# Patient Record
Sex: Male | Born: 1950 | Race: White | Hispanic: No | Marital: Married | Smoking: Current every day smoker
Health system: Southern US, Community
[De-identification: ages and names within clinical notes are randomized; demographics above are authoritative.]

## PROBLEM LIST (undated history)

## (undated) DIAGNOSIS — F411 Generalized anxiety disorder: Secondary | ICD-10-CM

## (undated) DIAGNOSIS — M720 Palmar fascial fibromatosis [Dupuytren]: Secondary | ICD-10-CM

## (undated) DIAGNOSIS — M1990 Unspecified osteoarthritis, unspecified site: Secondary | ICD-10-CM

## (undated) DIAGNOSIS — I10 Essential (primary) hypertension: Secondary | ICD-10-CM

## (undated) DIAGNOSIS — I4891 Unspecified atrial fibrillation: Secondary | ICD-10-CM

## (undated) DIAGNOSIS — I499 Cardiac arrhythmia, unspecified: Secondary | ICD-10-CM

## (undated) DIAGNOSIS — I639 Cerebral infarction, unspecified: Secondary | ICD-10-CM

## (undated) DIAGNOSIS — B1920 Unspecified viral hepatitis C without hepatic coma: Secondary | ICD-10-CM

## (undated) DIAGNOSIS — I509 Heart failure, unspecified: Secondary | ICD-10-CM

## (undated) DIAGNOSIS — J449 Chronic obstructive pulmonary disease, unspecified: Secondary | ICD-10-CM

## (undated) HISTORY — PX: TENDON REPAIR: SHX5111

## (undated) HISTORY — PX: HERNIA REPAIR: SHX51

---

## 2019-10-13 DIAGNOSIS — I10 Essential (primary) hypertension: Secondary | ICD-10-CM | POA: Diagnosis not present

## 2019-10-13 DIAGNOSIS — E785 Hyperlipidemia, unspecified: Secondary | ICD-10-CM | POA: Diagnosis not present

## 2019-10-13 DIAGNOSIS — Z125 Encounter for screening for malignant neoplasm of prostate: Secondary | ICD-10-CM | POA: Diagnosis not present

## 2019-10-13 DIAGNOSIS — J449 Chronic obstructive pulmonary disease, unspecified: Secondary | ICD-10-CM | POA: Diagnosis not present

## 2019-10-13 DIAGNOSIS — Z9181 History of falling: Secondary | ICD-10-CM | POA: Diagnosis not present

## 2019-10-13 DIAGNOSIS — I509 Heart failure, unspecified: Secondary | ICD-10-CM | POA: Diagnosis not present

## 2019-10-13 DIAGNOSIS — Z8673 Personal history of transient ischemic attack (TIA), and cerebral infarction without residual deficits: Secondary | ICD-10-CM | POA: Diagnosis not present

## 2019-10-13 DIAGNOSIS — Z139 Encounter for screening, unspecified: Secondary | ICD-10-CM | POA: Diagnosis not present

## 2019-10-13 DIAGNOSIS — Z79899 Other long term (current) drug therapy: Secondary | ICD-10-CM | POA: Diagnosis not present

## 2020-01-18 DIAGNOSIS — Z20828 Contact with and (suspected) exposure to other viral communicable diseases: Secondary | ICD-10-CM | POA: Diagnosis not present

## 2020-01-18 DIAGNOSIS — I509 Heart failure, unspecified: Secondary | ICD-10-CM | POA: Diagnosis not present

## 2020-01-18 DIAGNOSIS — Z87891 Personal history of nicotine dependence: Secondary | ICD-10-CM | POA: Diagnosis not present

## 2020-01-18 DIAGNOSIS — I252 Old myocardial infarction: Secondary | ICD-10-CM | POA: Diagnosis not present

## 2020-01-18 DIAGNOSIS — I4891 Unspecified atrial fibrillation: Secondary | ICD-10-CM | POA: Diagnosis not present

## 2020-01-18 DIAGNOSIS — R079 Chest pain, unspecified: Secondary | ICD-10-CM | POA: Diagnosis not present

## 2020-01-18 DIAGNOSIS — F411 Generalized anxiety disorder: Secondary | ICD-10-CM | POA: Diagnosis not present

## 2020-01-18 DIAGNOSIS — B182 Chronic viral hepatitis C: Secondary | ICD-10-CM | POA: Diagnosis not present

## 2020-01-18 DIAGNOSIS — I110 Hypertensive heart disease with heart failure: Secondary | ICD-10-CM | POA: Diagnosis not present

## 2020-01-18 DIAGNOSIS — I639 Cerebral infarction, unspecified: Secondary | ICD-10-CM | POA: Diagnosis not present

## 2020-01-18 DIAGNOSIS — J449 Chronic obstructive pulmonary disease, unspecified: Secondary | ICD-10-CM | POA: Diagnosis not present

## 2020-01-18 DIAGNOSIS — R0602 Shortness of breath: Secondary | ICD-10-CM | POA: Diagnosis not present

## 2020-01-18 DIAGNOSIS — I10 Essential (primary) hypertension: Secondary | ICD-10-CM | POA: Diagnosis not present

## 2020-01-18 DIAGNOSIS — I5033 Acute on chronic diastolic (congestive) heart failure: Secondary | ICD-10-CM | POA: Diagnosis not present

## 2020-01-18 DIAGNOSIS — Z8673 Personal history of transient ischemic attack (TIA), and cerebral infarction without residual deficits: Secondary | ICD-10-CM | POA: Diagnosis not present

## 2020-01-19 DIAGNOSIS — B182 Chronic viral hepatitis C: Secondary | ICD-10-CM | POA: Diagnosis not present

## 2020-01-19 DIAGNOSIS — I5033 Acute on chronic diastolic (congestive) heart failure: Secondary | ICD-10-CM | POA: Diagnosis not present

## 2020-01-19 DIAGNOSIS — F17211 Nicotine dependence, cigarettes, in remission: Secondary | ICD-10-CM | POA: Diagnosis not present

## 2020-01-19 DIAGNOSIS — I4891 Unspecified atrial fibrillation: Secondary | ICD-10-CM | POA: Diagnosis not present

## 2020-01-19 DIAGNOSIS — F411 Generalized anxiety disorder: Secondary | ICD-10-CM | POA: Diagnosis not present

## 2020-01-19 DIAGNOSIS — I517 Cardiomegaly: Secondary | ICD-10-CM | POA: Diagnosis not present

## 2020-01-19 DIAGNOSIS — I081 Rheumatic disorders of both mitral and tricuspid valves: Secondary | ICD-10-CM | POA: Diagnosis not present

## 2020-01-19 DIAGNOSIS — I420 Dilated cardiomyopathy: Secondary | ICD-10-CM | POA: Diagnosis not present

## 2020-01-19 DIAGNOSIS — I10 Essential (primary) hypertension: Secondary | ICD-10-CM | POA: Diagnosis not present

## 2020-01-19 DIAGNOSIS — I252 Old myocardial infarction: Secondary | ICD-10-CM | POA: Diagnosis not present

## 2020-01-19 DIAGNOSIS — J431 Panlobular emphysema: Secondary | ICD-10-CM | POA: Diagnosis not present

## 2020-01-19 DIAGNOSIS — J449 Chronic obstructive pulmonary disease, unspecified: Secondary | ICD-10-CM | POA: Diagnosis not present

## 2020-01-19 DIAGNOSIS — I110 Hypertensive heart disease with heart failure: Secondary | ICD-10-CM | POA: Diagnosis not present

## 2020-01-19 DIAGNOSIS — Z87891 Personal history of nicotine dependence: Secondary | ICD-10-CM | POA: Diagnosis not present

## 2020-01-19 DIAGNOSIS — Z8673 Personal history of transient ischemic attack (TIA), and cerebral infarction without residual deficits: Secondary | ICD-10-CM | POA: Diagnosis not present

## 2020-01-20 DIAGNOSIS — I69998 Other sequelae following unspecified cerebrovascular disease: Secondary | ICD-10-CM | POA: Diagnosis not present

## 2020-01-20 DIAGNOSIS — J431 Panlobular emphysema: Secondary | ICD-10-CM | POA: Diagnosis not present

## 2020-01-20 DIAGNOSIS — I081 Rheumatic disorders of both mitral and tricuspid valves: Secondary | ICD-10-CM | POA: Diagnosis not present

## 2020-01-20 DIAGNOSIS — F17211 Nicotine dependence, cigarettes, in remission: Secondary | ICD-10-CM | POA: Diagnosis not present

## 2020-01-20 DIAGNOSIS — G4730 Sleep apnea, unspecified: Secondary | ICD-10-CM | POA: Diagnosis not present

## 2020-01-20 DIAGNOSIS — B1920 Unspecified viral hepatitis C without hepatic coma: Secondary | ICD-10-CM | POA: Diagnosis not present

## 2020-01-20 DIAGNOSIS — I5033 Acute on chronic diastolic (congestive) heart failure: Secondary | ICD-10-CM | POA: Diagnosis not present

## 2020-01-20 DIAGNOSIS — M1990 Unspecified osteoarthritis, unspecified site: Secondary | ICD-10-CM | POA: Diagnosis not present

## 2020-01-20 DIAGNOSIS — R079 Chest pain, unspecified: Secondary | ICD-10-CM | POA: Diagnosis not present

## 2020-01-20 DIAGNOSIS — I110 Hypertensive heart disease with heart failure: Secondary | ICD-10-CM | POA: Diagnosis not present

## 2020-01-20 DIAGNOSIS — I420 Dilated cardiomyopathy: Secondary | ICD-10-CM | POA: Diagnosis not present

## 2020-01-20 DIAGNOSIS — I4891 Unspecified atrial fibrillation: Secondary | ICD-10-CM | POA: Diagnosis not present

## 2020-01-20 DIAGNOSIS — I517 Cardiomegaly: Secondary | ICD-10-CM | POA: Diagnosis not present

## 2020-01-20 DIAGNOSIS — I509 Heart failure, unspecified: Secondary | ICD-10-CM | POA: Diagnosis not present

## 2020-01-20 DIAGNOSIS — Z8673 Personal history of transient ischemic attack (TIA), and cerebral infarction without residual deficits: Secondary | ICD-10-CM | POA: Diagnosis not present

## 2020-01-20 DIAGNOSIS — J449 Chronic obstructive pulmonary disease, unspecified: Secondary | ICD-10-CM | POA: Diagnosis not present

## 2020-01-20 DIAGNOSIS — I10 Essential (primary) hypertension: Secondary | ICD-10-CM | POA: Diagnosis not present

## 2020-01-20 DIAGNOSIS — Z7901 Long term (current) use of anticoagulants: Secondary | ICD-10-CM | POA: Diagnosis not present

## 2020-01-21 DIAGNOSIS — Z8673 Personal history of transient ischemic attack (TIA), and cerebral infarction without residual deficits: Secondary | ICD-10-CM | POA: Diagnosis not present

## 2020-01-21 DIAGNOSIS — I4891 Unspecified atrial fibrillation: Secondary | ICD-10-CM | POA: Diagnosis not present

## 2020-01-21 DIAGNOSIS — I10 Essential (primary) hypertension: Secondary | ICD-10-CM | POA: Diagnosis not present

## 2020-01-21 DIAGNOSIS — I420 Dilated cardiomyopathy: Secondary | ICD-10-CM | POA: Diagnosis not present

## 2020-01-21 DIAGNOSIS — J431 Panlobular emphysema: Secondary | ICD-10-CM | POA: Diagnosis not present

## 2020-01-21 DIAGNOSIS — F17211 Nicotine dependence, cigarettes, in remission: Secondary | ICD-10-CM | POA: Diagnosis not present

## 2020-01-21 DIAGNOSIS — I5033 Acute on chronic diastolic (congestive) heart failure: Secondary | ICD-10-CM | POA: Diagnosis not present

## 2020-02-01 DIAGNOSIS — I480 Paroxysmal atrial fibrillation: Secondary | ICD-10-CM | POA: Diagnosis not present

## 2020-02-01 DIAGNOSIS — I5021 Acute systolic (congestive) heart failure: Secondary | ICD-10-CM | POA: Diagnosis not present

## 2020-02-01 DIAGNOSIS — I10 Essential (primary) hypertension: Secondary | ICD-10-CM | POA: Diagnosis not present

## 2020-02-16 DIAGNOSIS — I509 Heart failure, unspecified: Secondary | ICD-10-CM | POA: Diagnosis not present

## 2020-02-16 DIAGNOSIS — J449 Chronic obstructive pulmonary disease, unspecified: Secondary | ICD-10-CM | POA: Diagnosis not present

## 2020-02-16 DIAGNOSIS — I4891 Unspecified atrial fibrillation: Secondary | ICD-10-CM | POA: Diagnosis not present

## 2020-02-16 DIAGNOSIS — I10 Essential (primary) hypertension: Secondary | ICD-10-CM | POA: Diagnosis not present

## 2020-02-16 DIAGNOSIS — Z6828 Body mass index (BMI) 28.0-28.9, adult: Secondary | ICD-10-CM | POA: Diagnosis not present

## 2020-05-11 DIAGNOSIS — J449 Chronic obstructive pulmonary disease, unspecified: Secondary | ICD-10-CM | POA: Diagnosis not present

## 2020-05-11 DIAGNOSIS — I4891 Unspecified atrial fibrillation: Secondary | ICD-10-CM | POA: Diagnosis not present

## 2020-05-11 DIAGNOSIS — I509 Heart failure, unspecified: Secondary | ICD-10-CM | POA: Diagnosis not present

## 2020-05-11 DIAGNOSIS — E785 Hyperlipidemia, unspecified: Secondary | ICD-10-CM | POA: Diagnosis not present

## 2020-05-11 DIAGNOSIS — Z79899 Other long term (current) drug therapy: Secondary | ICD-10-CM | POA: Diagnosis not present

## 2020-05-11 DIAGNOSIS — Z6829 Body mass index (BMI) 29.0-29.9, adult: Secondary | ICD-10-CM | POA: Diagnosis not present

## 2020-05-16 DIAGNOSIS — I509 Heart failure, unspecified: Secondary | ICD-10-CM | POA: Diagnosis not present

## 2020-05-16 DIAGNOSIS — S301XXA Contusion of abdominal wall, initial encounter: Secondary | ICD-10-CM | POA: Diagnosis not present

## 2020-05-16 DIAGNOSIS — Z0389 Encounter for observation for other suspected diseases and conditions ruled out: Secondary | ICD-10-CM | POA: Diagnosis not present

## 2020-05-16 DIAGNOSIS — Z8673 Personal history of transient ischemic attack (TIA), and cerebral infarction without residual deficits: Secondary | ICD-10-CM | POA: Diagnosis not present

## 2020-05-16 DIAGNOSIS — I10 Essential (primary) hypertension: Secondary | ICD-10-CM | POA: Diagnosis not present

## 2020-05-16 DIAGNOSIS — S61512A Laceration without foreign body of left wrist, initial encounter: Secondary | ICD-10-CM | POA: Diagnosis not present

## 2020-05-16 DIAGNOSIS — J449 Chronic obstructive pulmonary disease, unspecified: Secondary | ICD-10-CM | POA: Diagnosis not present

## 2020-05-16 DIAGNOSIS — K5790 Diverticulosis of intestine, part unspecified, without perforation or abscess without bleeding: Secondary | ICD-10-CM | POA: Diagnosis not present

## 2020-05-16 DIAGNOSIS — S60222A Contusion of left hand, initial encounter: Secondary | ICD-10-CM | POA: Diagnosis not present

## 2020-05-16 DIAGNOSIS — Z7901 Long term (current) use of anticoagulants: Secondary | ICD-10-CM | POA: Diagnosis not present

## 2020-05-16 DIAGNOSIS — E871 Hypo-osmolality and hyponatremia: Secondary | ICD-10-CM | POA: Diagnosis not present

## 2020-05-16 DIAGNOSIS — Z043 Encounter for examination and observation following other accident: Secondary | ICD-10-CM | POA: Diagnosis not present

## 2020-05-16 DIAGNOSIS — Z7982 Long term (current) use of aspirin: Secondary | ICD-10-CM | POA: Diagnosis not present

## 2020-05-16 DIAGNOSIS — S61412A Laceration without foreign body of left hand, initial encounter: Secondary | ICD-10-CM | POA: Diagnosis not present

## 2020-11-09 DIAGNOSIS — J449 Chronic obstructive pulmonary disease, unspecified: Secondary | ICD-10-CM | POA: Diagnosis not present

## 2020-11-09 DIAGNOSIS — Z79899 Other long term (current) drug therapy: Secondary | ICD-10-CM | POA: Diagnosis not present

## 2020-11-09 DIAGNOSIS — Z1331 Encounter for screening for depression: Secondary | ICD-10-CM | POA: Diagnosis not present

## 2020-11-09 DIAGNOSIS — Z125 Encounter for screening for malignant neoplasm of prostate: Secondary | ICD-10-CM | POA: Diagnosis not present

## 2020-11-09 DIAGNOSIS — Z1211 Encounter for screening for malignant neoplasm of colon: Secondary | ICD-10-CM | POA: Diagnosis not present

## 2020-11-09 DIAGNOSIS — Z6829 Body mass index (BMI) 29.0-29.9, adult: Secondary | ICD-10-CM | POA: Diagnosis not present

## 2020-11-09 DIAGNOSIS — I509 Heart failure, unspecified: Secondary | ICD-10-CM | POA: Diagnosis not present

## 2020-11-09 DIAGNOSIS — I4891 Unspecified atrial fibrillation: Secondary | ICD-10-CM | POA: Diagnosis not present

## 2020-11-09 DIAGNOSIS — E785 Hyperlipidemia, unspecified: Secondary | ICD-10-CM | POA: Diagnosis not present

## 2021-07-11 DIAGNOSIS — I420 Dilated cardiomyopathy: Secondary | ICD-10-CM | POA: Diagnosis not present

## 2021-07-11 DIAGNOSIS — I480 Paroxysmal atrial fibrillation: Secondary | ICD-10-CM | POA: Diagnosis not present

## 2021-07-11 DIAGNOSIS — E782 Mixed hyperlipidemia: Secondary | ICD-10-CM | POA: Diagnosis not present

## 2021-07-11 DIAGNOSIS — I10 Essential (primary) hypertension: Secondary | ICD-10-CM | POA: Diagnosis not present

## 2021-08-24 DIAGNOSIS — E782 Mixed hyperlipidemia: Secondary | ICD-10-CM | POA: Diagnosis not present

## 2021-08-24 DIAGNOSIS — I480 Paroxysmal atrial fibrillation: Secondary | ICD-10-CM | POA: Diagnosis not present

## 2021-08-24 DIAGNOSIS — I420 Dilated cardiomyopathy: Secondary | ICD-10-CM | POA: Diagnosis not present

## 2021-08-24 DIAGNOSIS — I10 Essential (primary) hypertension: Secondary | ICD-10-CM | POA: Diagnosis not present

## 2021-10-10 DIAGNOSIS — I420 Dilated cardiomyopathy: Secondary | ICD-10-CM | POA: Diagnosis not present

## 2021-10-10 DIAGNOSIS — E782 Mixed hyperlipidemia: Secondary | ICD-10-CM | POA: Diagnosis not present

## 2021-10-10 DIAGNOSIS — M79605 Pain in left leg: Secondary | ICD-10-CM | POA: Diagnosis not present

## 2021-10-10 DIAGNOSIS — M79604 Pain in right leg: Secondary | ICD-10-CM | POA: Diagnosis not present

## 2021-10-10 DIAGNOSIS — I10 Essential (primary) hypertension: Secondary | ICD-10-CM | POA: Diagnosis not present

## 2021-10-10 DIAGNOSIS — I480 Paroxysmal atrial fibrillation: Secondary | ICD-10-CM | POA: Diagnosis not present

## 2021-11-03 DIAGNOSIS — M542 Cervicalgia: Secondary | ICD-10-CM | POA: Diagnosis not present

## 2021-11-03 DIAGNOSIS — M546 Pain in thoracic spine: Secondary | ICD-10-CM | POA: Diagnosis not present

## 2021-11-03 DIAGNOSIS — M5136 Other intervertebral disc degeneration, lumbar region: Secondary | ICD-10-CM | POA: Diagnosis not present

## 2021-11-05 DIAGNOSIS — M542 Cervicalgia: Secondary | ICD-10-CM | POA: Diagnosis not present

## 2021-11-05 DIAGNOSIS — I509 Heart failure, unspecified: Secondary | ICD-10-CM | POA: Diagnosis not present

## 2021-11-05 DIAGNOSIS — J449 Chronic obstructive pulmonary disease, unspecified: Secondary | ICD-10-CM | POA: Diagnosis not present

## 2021-11-05 DIAGNOSIS — I4891 Unspecified atrial fibrillation: Secondary | ICD-10-CM | POA: Diagnosis not present

## 2021-11-05 DIAGNOSIS — Z6830 Body mass index (BMI) 30.0-30.9, adult: Secondary | ICD-10-CM | POA: Diagnosis not present

## 2021-11-17 ENCOUNTER — Other Ambulatory Visit: Payer: Self-pay

## 2021-11-17 ENCOUNTER — Inpatient Hospital Stay (HOSPITAL_COMMUNITY): Payer: Medicare HMO | Admitting: Certified Registered"

## 2021-11-17 ENCOUNTER — Inpatient Hospital Stay (HOSPITAL_COMMUNITY): Payer: Medicare HMO

## 2021-11-17 ENCOUNTER — Emergency Department (HOSPITAL_COMMUNITY): Payer: Medicare HMO

## 2021-11-17 ENCOUNTER — Encounter (HOSPITAL_COMMUNITY): Admission: EM | Disposition: E | Payer: Self-pay | Source: Home / Self Care | Attending: Emergency Medicine

## 2021-11-17 ENCOUNTER — Encounter (HOSPITAL_COMMUNITY): Payer: Self-pay

## 2021-11-17 ENCOUNTER — Inpatient Hospital Stay (HOSPITAL_COMMUNITY)
Admission: EM | Admit: 2021-11-17 | Discharge: 2021-12-01 | DRG: 471 | Disposition: E | Payer: Medicare HMO | Attending: Internal Medicine | Admitting: Internal Medicine

## 2021-11-17 DIAGNOSIS — Z8673 Personal history of transient ischemic attack (TIA), and cerebral infarction without residual deficits: Secondary | ICD-10-CM

## 2021-11-17 DIAGNOSIS — M436 Torticollis: Secondary | ICD-10-CM | POA: Diagnosis not present

## 2021-11-17 DIAGNOSIS — M50022 Cervical disc disorder at C5-C6 level with myelopathy: Secondary | ICD-10-CM

## 2021-11-17 DIAGNOSIS — E440 Moderate protein-calorie malnutrition: Secondary | ICD-10-CM | POA: Diagnosis present

## 2021-11-17 DIAGNOSIS — Z7901 Long term (current) use of anticoagulants: Secondary | ICD-10-CM

## 2021-11-17 DIAGNOSIS — B1920 Unspecified viral hepatitis C without hepatic coma: Secondary | ICD-10-CM | POA: Diagnosis present

## 2021-11-17 DIAGNOSIS — J189 Pneumonia, unspecified organism: Secondary | ICD-10-CM | POA: Diagnosis not present

## 2021-11-17 DIAGNOSIS — K55049 Acute infarction of large intestine, extent unspecified: Secondary | ICD-10-CM | POA: Diagnosis not present

## 2021-11-17 DIAGNOSIS — J9601 Acute respiratory failure with hypoxia: Secondary | ICD-10-CM | POA: Diagnosis not present

## 2021-11-17 DIAGNOSIS — I252 Old myocardial infarction: Secondary | ICD-10-CM | POA: Diagnosis not present

## 2021-11-17 DIAGNOSIS — T17908A Unspecified foreign body in respiratory tract, part unspecified causing other injury, initial encounter: Secondary | ICD-10-CM | POA: Diagnosis not present

## 2021-11-17 DIAGNOSIS — R739 Hyperglycemia, unspecified: Secondary | ICD-10-CM | POA: Diagnosis not present

## 2021-11-17 DIAGNOSIS — I5022 Chronic systolic (congestive) heart failure: Secondary | ICD-10-CM | POA: Diagnosis not present

## 2021-11-17 DIAGNOSIS — Z981 Arthrodesis status: Secondary | ICD-10-CM | POA: Diagnosis not present

## 2021-11-17 DIAGNOSIS — M720 Palmar fascial fibromatosis [Dupuytren]: Secondary | ICD-10-CM | POA: Diagnosis present

## 2021-11-17 DIAGNOSIS — K659 Peritonitis, unspecified: Secondary | ICD-10-CM | POA: Diagnosis not present

## 2021-11-17 DIAGNOSIS — J984 Other disorders of lung: Secondary | ICD-10-CM | POA: Diagnosis not present

## 2021-11-17 DIAGNOSIS — I2510 Atherosclerotic heart disease of native coronary artery without angina pectoris: Secondary | ICD-10-CM | POA: Diagnosis not present

## 2021-11-17 DIAGNOSIS — K5720 Diverticulitis of large intestine with perforation and abscess without bleeding: Secondary | ICD-10-CM | POA: Diagnosis present

## 2021-11-17 DIAGNOSIS — J449 Chronic obstructive pulmonary disease, unspecified: Secondary | ICD-10-CM | POA: Diagnosis not present

## 2021-11-17 DIAGNOSIS — J9811 Atelectasis: Secondary | ICD-10-CM | POA: Diagnosis not present

## 2021-11-17 DIAGNOSIS — I480 Paroxysmal atrial fibrillation: Secondary | ICD-10-CM | POA: Diagnosis not present

## 2021-11-17 DIAGNOSIS — F17210 Nicotine dependence, cigarettes, uncomplicated: Secondary | ICD-10-CM | POA: Diagnosis present

## 2021-11-17 DIAGNOSIS — R262 Difficulty in walking, not elsewhere classified: Secondary | ICD-10-CM | POA: Diagnosis not present

## 2021-11-17 DIAGNOSIS — R109 Unspecified abdominal pain: Secondary | ICD-10-CM | POA: Diagnosis not present

## 2021-11-17 DIAGNOSIS — I110 Hypertensive heart disease with heart failure: Secondary | ICD-10-CM

## 2021-11-17 DIAGNOSIS — G8250 Quadriplegia, unspecified: Secondary | ICD-10-CM

## 2021-11-17 DIAGNOSIS — I4891 Unspecified atrial fibrillation: Secondary | ICD-10-CM | POA: Diagnosis not present

## 2021-11-17 DIAGNOSIS — Z8679 Personal history of other diseases of the circulatory system: Secondary | ICD-10-CM | POA: Diagnosis not present

## 2021-11-17 DIAGNOSIS — R201 Hypoesthesia of skin: Secondary | ICD-10-CM | POA: Diagnosis present

## 2021-11-17 DIAGNOSIS — K6389 Other specified diseases of intestine: Secondary | ICD-10-CM | POA: Diagnosis not present

## 2021-11-17 DIAGNOSIS — K567 Ileus, unspecified: Secondary | ICD-10-CM | POA: Diagnosis not present

## 2021-11-17 DIAGNOSIS — J90 Pleural effusion, not elsewhere classified: Secondary | ICD-10-CM | POA: Diagnosis not present

## 2021-11-17 DIAGNOSIS — F411 Generalized anxiety disorder: Secondary | ICD-10-CM | POA: Diagnosis present

## 2021-11-17 DIAGNOSIS — E8809 Other disorders of plasma-protein metabolism, not elsewhere classified: Secondary | ICD-10-CM | POA: Diagnosis present

## 2021-11-17 DIAGNOSIS — I10 Essential (primary) hypertension: Secondary | ICD-10-CM

## 2021-11-17 DIAGNOSIS — I509 Heart failure, unspecified: Secondary | ICD-10-CM

## 2021-11-17 DIAGNOSIS — K658 Other peritonitis: Secondary | ICD-10-CM | POA: Diagnosis not present

## 2021-11-17 DIAGNOSIS — R0603 Acute respiratory distress: Secondary | ICD-10-CM | POA: Diagnosis not present

## 2021-11-17 DIAGNOSIS — Z79899 Other long term (current) drug therapy: Secondary | ICD-10-CM

## 2021-11-17 DIAGNOSIS — K429 Umbilical hernia without obstruction or gangrene: Secondary | ICD-10-CM | POA: Diagnosis not present

## 2021-11-17 DIAGNOSIS — D696 Thrombocytopenia, unspecified: Secondary | ICD-10-CM | POA: Diagnosis present

## 2021-11-17 DIAGNOSIS — J690 Pneumonitis due to inhalation of food and vomit: Secondary | ICD-10-CM | POA: Diagnosis not present

## 2021-11-17 DIAGNOSIS — M1990 Unspecified osteoarthritis, unspecified site: Secondary | ICD-10-CM

## 2021-11-17 DIAGNOSIS — R69 Illness, unspecified: Secondary | ICD-10-CM | POA: Diagnosis not present

## 2021-11-17 DIAGNOSIS — R0902 Hypoxemia: Secondary | ICD-10-CM | POA: Diagnosis not present

## 2021-11-17 DIAGNOSIS — G9520 Unspecified cord compression: Secondary | ICD-10-CM | POA: Diagnosis present

## 2021-11-17 DIAGNOSIS — E778 Other disorders of glycoprotein metabolism: Secondary | ICD-10-CM | POA: Diagnosis present

## 2021-11-17 DIAGNOSIS — Z66 Do not resuscitate: Secondary | ICD-10-CM | POA: Diagnosis not present

## 2021-11-17 DIAGNOSIS — R6521 Severe sepsis with septic shock: Secondary | ICD-10-CM | POA: Diagnosis not present

## 2021-11-17 DIAGNOSIS — M48061 Spinal stenosis, lumbar region without neurogenic claudication: Secondary | ICD-10-CM | POA: Diagnosis not present

## 2021-11-17 DIAGNOSIS — M4852XA Collapsed vertebra, not elsewhere classified, cervical region, initial encounter for fracture: Secondary | ICD-10-CM | POA: Diagnosis not present

## 2021-11-17 DIAGNOSIS — R140 Abdominal distension (gaseous): Secondary | ICD-10-CM | POA: Diagnosis not present

## 2021-11-17 DIAGNOSIS — Z91A9 Caregiver's noncompliance with patient's other medical treatment and regimen: Secondary | ICD-10-CM

## 2021-11-17 DIAGNOSIS — R54 Age-related physical debility: Secondary | ICD-10-CM | POA: Diagnosis present

## 2021-11-17 DIAGNOSIS — Z515 Encounter for palliative care: Secondary | ICD-10-CM | POA: Diagnosis not present

## 2021-11-17 DIAGNOSIS — R0600 Dyspnea, unspecified: Secondary | ICD-10-CM | POA: Diagnosis not present

## 2021-11-17 DIAGNOSIS — R339 Retention of urine, unspecified: Secondary | ICD-10-CM | POA: Diagnosis not present

## 2021-11-17 DIAGNOSIS — J9600 Acute respiratory failure, unspecified whether with hypoxia or hypercapnia: Secondary | ICD-10-CM | POA: Diagnosis not present

## 2021-11-17 DIAGNOSIS — A419 Sepsis, unspecified organism: Secondary | ICD-10-CM | POA: Diagnosis not present

## 2021-11-17 DIAGNOSIS — I5043 Acute on chronic combined systolic (congestive) and diastolic (congestive) heart failure: Secondary | ICD-10-CM | POA: Diagnosis present

## 2021-11-17 DIAGNOSIS — K631 Perforation of intestine (nontraumatic): Secondary | ICD-10-CM | POA: Diagnosis not present

## 2021-11-17 DIAGNOSIS — E785 Hyperlipidemia, unspecified: Secondary | ICD-10-CM | POA: Diagnosis present

## 2021-11-17 DIAGNOSIS — M4322 Fusion of spine, cervical region: Secondary | ICD-10-CM | POA: Diagnosis not present

## 2021-11-17 DIAGNOSIS — J9602 Acute respiratory failure with hypercapnia: Secondary | ICD-10-CM | POA: Diagnosis not present

## 2021-11-17 DIAGNOSIS — J9690 Respiratory failure, unspecified, unspecified whether with hypoxia or hypercapnia: Secondary | ICD-10-CM | POA: Diagnosis not present

## 2021-11-17 DIAGNOSIS — N261 Atrophy of kidney (terminal): Secondary | ICD-10-CM | POA: Diagnosis not present

## 2021-11-17 DIAGNOSIS — I5032 Chronic diastolic (congestive) heart failure: Secondary | ICD-10-CM | POA: Diagnosis not present

## 2021-11-17 DIAGNOSIS — D649 Anemia, unspecified: Secondary | ICD-10-CM | POA: Diagnosis not present

## 2021-11-17 DIAGNOSIS — K5730 Diverticulosis of large intestine without perforation or abscess without bleeding: Secondary | ICD-10-CM | POA: Diagnosis not present

## 2021-11-17 DIAGNOSIS — M50222 Other cervical disc displacement at C5-C6 level: Secondary | ICD-10-CM | POA: Diagnosis not present

## 2021-11-17 DIAGNOSIS — Z4682 Encounter for fitting and adjustment of non-vascular catheter: Secondary | ICD-10-CM | POA: Diagnosis not present

## 2021-11-17 DIAGNOSIS — M4802 Spinal stenosis, cervical region: Secondary | ICD-10-CM | POA: Diagnosis present

## 2021-11-17 DIAGNOSIS — L89891 Pressure ulcer of other site, stage 1: Secondary | ICD-10-CM | POA: Diagnosis not present

## 2021-11-17 DIAGNOSIS — R601 Generalized edema: Secondary | ICD-10-CM | POA: Diagnosis not present

## 2021-11-17 DIAGNOSIS — M549 Dorsalgia, unspecified: Secondary | ICD-10-CM | POA: Diagnosis not present

## 2021-11-17 DIAGNOSIS — I700 Atherosclerosis of aorta: Secondary | ICD-10-CM | POA: Diagnosis not present

## 2021-11-17 DIAGNOSIS — R531 Weakness: Secondary | ICD-10-CM | POA: Diagnosis not present

## 2021-11-17 DIAGNOSIS — R21 Rash and other nonspecific skin eruption: Secondary | ICD-10-CM | POA: Diagnosis not present

## 2021-11-17 DIAGNOSIS — Z743 Need for continuous supervision: Secondary | ICD-10-CM | POA: Diagnosis not present

## 2021-11-17 DIAGNOSIS — K668 Other specified disorders of peritoneum: Secondary | ICD-10-CM | POA: Diagnosis not present

## 2021-11-17 HISTORY — DX: Generalized anxiety disorder: F41.1

## 2021-11-17 HISTORY — DX: Unspecified atrial fibrillation: I48.91

## 2021-11-17 HISTORY — DX: Chronic obstructive pulmonary disease, unspecified: J44.9

## 2021-11-17 HISTORY — DX: Personal history of transient ischemic attack (TIA), and cerebral infarction without residual deficits: Z86.73

## 2021-11-17 HISTORY — DX: Cardiac arrhythmia, unspecified: I49.9

## 2021-11-17 HISTORY — DX: Old myocardial infarction: I25.2

## 2021-11-17 HISTORY — PX: ANTERIOR CERVICAL DECOMP/DISCECTOMY FUSION: SHX1161

## 2021-11-17 HISTORY — DX: Cerebral infarction, unspecified: I63.9

## 2021-11-17 HISTORY — DX: Personal history of other diseases of the circulatory system: Z86.79

## 2021-11-17 HISTORY — DX: Unspecified osteoarthritis, unspecified site: M19.90

## 2021-11-17 HISTORY — DX: Palmar fascial fibromatosis (dupuytren): M72.0

## 2021-11-17 HISTORY — DX: Essential (primary) hypertension: I10

## 2021-11-17 HISTORY — DX: Heart failure, unspecified: I50.9

## 2021-11-17 HISTORY — DX: Unspecified viral hepatitis C without hepatic coma: B19.20

## 2021-11-17 LAB — CBC WITH DIFFERENTIAL/PLATELET
Abs Immature Granulocytes: 0.08 10*3/uL — ABNORMAL HIGH (ref 0.00–0.07)
Basophils Absolute: 0 10*3/uL (ref 0.0–0.1)
Basophils Relative: 0 %
Eosinophils Absolute: 0.1 10*3/uL (ref 0.0–0.5)
Eosinophils Relative: 1 %
HCT: 33.1 % — ABNORMAL LOW (ref 39.0–52.0)
Hemoglobin: 11.7 g/dL — ABNORMAL LOW (ref 13.0–17.0)
Immature Granulocytes: 1 %
Lymphocytes Relative: 16 %
Lymphs Abs: 1.7 10*3/uL (ref 0.7–4.0)
MCH: 32.3 pg (ref 26.0–34.0)
MCHC: 35.3 g/dL (ref 30.0–36.0)
MCV: 91.4 fL (ref 80.0–100.0)
Monocytes Absolute: 0.9 10*3/uL (ref 0.1–1.0)
Monocytes Relative: 8 %
Neutro Abs: 8.1 10*3/uL — ABNORMAL HIGH (ref 1.7–7.7)
Neutrophils Relative %: 74 %
Platelets: 144 10*3/uL — ABNORMAL LOW (ref 150–400)
RBC: 3.62 MIL/uL — ABNORMAL LOW (ref 4.22–5.81)
RDW: 11.9 % (ref 11.5–15.5)
WBC: 10.8 10*3/uL — ABNORMAL HIGH (ref 4.0–10.5)
nRBC: 0 % (ref 0.0–0.2)

## 2021-11-17 LAB — COMPREHENSIVE METABOLIC PANEL
ALT: 15 U/L (ref 0–44)
AST: 14 U/L — ABNORMAL LOW (ref 15–41)
Albumin: 2.8 g/dL — ABNORMAL LOW (ref 3.5–5.0)
Alkaline Phosphatase: 30 U/L — ABNORMAL LOW (ref 38–126)
Anion gap: 7 (ref 5–15)
BUN: 11 mg/dL (ref 8–23)
CO2: 23 mmol/L (ref 22–32)
Calcium: 7.7 mg/dL — ABNORMAL LOW (ref 8.9–10.3)
Chloride: 105 mmol/L (ref 98–111)
Creatinine, Ser: 1.16 mg/dL (ref 0.61–1.24)
GFR, Estimated: >60 mL/min (ref >60)
Glucose, Bld: 102 mg/dL — ABNORMAL HIGH (ref 70–99)
Potassium: 3.5 mmol/L (ref 3.5–5.1)
Sodium: 135 mmol/L (ref 135–145)
Total Bilirubin: 0.5 mg/dL (ref 0.3–1.2)
Total Protein: 4.4 g/dL — ABNORMAL LOW (ref 6.5–8.1)

## 2021-11-17 LAB — SURGICAL PCR SCREEN
MRSA, PCR: NEGATIVE
Staphylococcus aureus: NEGATIVE

## 2021-11-17 SURGERY — ANTERIOR CERVICAL DECOMPRESSION/DISCECTOMY FUSION 1 LEVEL
Anesthesia: General | Site: Neck

## 2021-11-17 MED ORDER — HYDROMORPHONE HCL 1 MG/ML IJ SOLN
0.5000 mg | INTRAMUSCULAR | Status: DC | PRN
  Administered 2021-11-19: 1 mg via INTRAVENOUS
  Filled 2021-11-17: qty 1

## 2021-11-17 MED ORDER — PHENYLEPHRINE 80 MCG/ML (10ML) SYRINGE FOR IV PUSH (FOR BLOOD PRESSURE SUPPORT)
PREFILLED_SYRINGE | INTRAVENOUS | Status: DC | PRN
  Administered 2021-11-17 (×3): 160 ug via INTRAVENOUS

## 2021-11-17 MED ORDER — PHENYLEPHRINE 80 MCG/ML (10ML) SYRINGE FOR IV PUSH (FOR BLOOD PRESSURE SUPPORT)
PREFILLED_SYRINGE | INTRAVENOUS | Status: AC
Start: 2021-11-17 — End: ?
  Filled 2021-11-17: qty 20

## 2021-11-17 MED ORDER — FLEET ENEMA 7-19 GM/118ML RE ENEM: 1.0000 | ENEMA | Freq: Once | RECTAL | Status: DC | PRN

## 2021-11-17 MED ORDER — HYDROCODONE-ACETAMINOPHEN 5-325 MG PO TABS
1.0000 | ORAL_TABLET | ORAL | Status: DC | PRN
  Administered 2021-11-17: 1 via ORAL
  Filled 2021-11-17: qty 1

## 2021-11-17 MED ORDER — METHOCARBAMOL 500 MG PO TABS: 500.0000 mg | ORAL_TABLET | Freq: Three times a day (TID) | ORAL | Status: DC | PRN

## 2021-11-17 MED ORDER — SODIUM CHLORIDE 0.9 % IV SOLN
250.0000 mL | INTRAVENOUS | Status: DC
  Administered 2021-11-17: 250 mL via INTRAVENOUS

## 2021-11-17 MED ORDER — ALUM & MAG HYDROXIDE-SIMETH 200-200-20 MG/5ML PO SUSP
30.0000 mL | Freq: Four times a day (QID) | ORAL | Status: DC | PRN
  Filled 2021-11-17: qty 30

## 2021-11-17 MED ORDER — ROCURONIUM BROMIDE 10 MG/ML (PF) SYRINGE
PREFILLED_SYRINGE | INTRAVENOUS | Status: AC
  Filled 2021-11-17: qty 10

## 2021-11-17 MED ORDER — LIDOCAINE-EPINEPHRINE 1 %-1:100000 IJ SOLN
INTRAMUSCULAR | Status: DC | PRN
  Administered 2021-11-17: 3.5 mL via INTRADERMAL

## 2021-11-17 MED ORDER — THROMBIN 5000 UNITS EX SOLR
CUTANEOUS | Status: AC
  Filled 2021-11-17: qty 5000

## 2021-11-17 MED ORDER — ONDANSETRON HCL 4 MG PO TABS: 4.0000 mg | ORAL_TABLET | Freq: Four times a day (QID) | ORAL | Status: DC | PRN

## 2021-11-17 MED ORDER — SENNA 8.6 MG PO TABS: 1.0000 | ORAL_TABLET | Freq: Two times a day (BID) | ORAL | Status: DC

## 2021-11-17 MED ORDER — DEXAMETHASONE SODIUM PHOSPHATE 10 MG/ML IJ SOLN
INTRAMUSCULAR | Status: DC | PRN
  Administered 2021-11-17: 10 mg via INTRAVENOUS

## 2021-11-17 MED ORDER — SUCCINYLCHOLINE CHLORIDE 200 MG/10ML IV SOSY
PREFILLED_SYRINGE | INTRAVENOUS | Status: DC | PRN
  Administered 2021-11-17: 140 mg via INTRAVENOUS

## 2021-11-17 MED ORDER — BISACODYL 10 MG RE SUPP: 10.0000 mg | Freq: Every day | RECTAL | Status: DC | PRN

## 2021-11-17 MED ORDER — FENTANYL CITRATE PF 50 MCG/ML IJ SOSY
50.0000 ug | PREFILLED_SYRINGE | Freq: Once | INTRAMUSCULAR | Status: AC
  Administered 2021-11-17: 50 ug via INTRAVENOUS

## 2021-11-17 MED ORDER — 0.9 % SODIUM CHLORIDE (POUR BTL) OPTIME
TOPICAL | Status: DC | PRN
  Administered 2021-11-17: 1000 mL

## 2021-11-17 MED ORDER — ACETAMINOPHEN 160 MG/5ML PO SOLN: 1000.0000 mg | Freq: Once | ORAL | Status: DC | PRN

## 2021-11-17 MED ORDER — PROPOFOL 10 MG/ML IV BOLUS
INTRAVENOUS | Status: AC
  Filled 2021-11-17: qty 20

## 2021-11-17 MED ORDER — THROMBIN 5000 UNITS EX SOLR
OROMUCOSAL | Status: DC | PRN
  Administered 2021-11-17: 5 mL via TOPICAL

## 2021-11-17 MED ORDER — SUFENTANIL CITRATE 50 MCG/ML IV SOLN
INTRAVENOUS | Status: DC | PRN
  Administered 2021-11-17: 10 ug via INTRAVENOUS

## 2021-11-17 MED ORDER — MIDAZOLAM HCL 2 MG/2ML IJ SOLN
INTRAMUSCULAR | Status: DC | PRN
  Administered 2021-11-17: 2 mg via INTRAVENOUS

## 2021-11-17 MED ORDER — SODIUM CHLORIDE 0.9% FLUSH
3.0000 mL | INTRAVENOUS | Status: DC | PRN
  Administered 2021-11-25 (×2): 3 mL via INTRAVENOUS

## 2021-11-17 MED ORDER — ACETAMINOPHEN 325 MG PO TABS
650.0000 mg | ORAL_TABLET | ORAL | Status: DC | PRN
  Administered 2021-11-18 – 2021-11-25 (×6): 650 mg via ORAL
  Filled 2021-11-17 (×6): qty 2

## 2021-11-17 MED ORDER — ORAL CARE MOUTH RINSE: 15.0000 mL | Freq: Once | OROMUCOSAL | Status: AC

## 2021-11-17 MED ORDER — DOCUSATE SODIUM 100 MG PO CAPS
100.0000 mg | ORAL_CAPSULE | Freq: Two times a day (BID) | ORAL | Status: DC
  Administered 2021-11-17 – 2021-11-18 (×2): 100 mg via ORAL
  Filled 2021-11-17 (×4): qty 1

## 2021-11-17 MED ORDER — FENTANYL CITRATE (PF) 100 MCG/2ML IJ SOLN: 25.0000 ug | INTRAMUSCULAR | Status: DC | PRN

## 2021-11-17 MED ORDER — LIDOCAINE-EPINEPHRINE 1 %-1:100000 IJ SOLN
INTRAMUSCULAR | Status: AC
  Filled 2021-11-17: qty 1

## 2021-11-17 MED ORDER — MORPHINE SULFATE (PF) 4 MG/ML IV SOLN
4.0000 mg | Freq: Once | INTRAVENOUS | Status: AC
  Administered 2021-11-17: 4 mg via INTRAVENOUS
  Filled 2021-11-17: qty 1

## 2021-11-17 MED ORDER — LIDOCAINE 2% (20 MG/ML) 5 ML SYRINGE
INTRAMUSCULAR | Status: AC
  Filled 2021-11-17: qty 5

## 2021-11-17 MED ORDER — FENTANYL CITRATE (PF) 100 MCG/2ML IJ SOLN: 50.0000 ug | Freq: Once | INTRAMUSCULAR | Status: AC

## 2021-11-17 MED ORDER — CEFAZOLIN SODIUM-DEXTROSE 2-4 GM/100ML-% IV SOLN
INTRAVENOUS | Status: AC
  Filled 2021-11-17: qty 100

## 2021-11-17 MED ORDER — DEXAMETHASONE SODIUM PHOSPHATE 10 MG/ML IJ SOLN
INTRAMUSCULAR | Status: AC
  Filled 2021-11-17: qty 1

## 2021-11-17 MED ORDER — CEFAZOLIN SODIUM-DEXTROSE 2-4 GM/100ML-% IV SOLN
2.0000 g | Freq: Three times a day (TID) | INTRAVENOUS | Status: AC
  Administered 2021-11-17 – 2021-11-18 (×2): 2 g via INTRAVENOUS
  Filled 2021-11-17 (×2): qty 100

## 2021-11-17 MED ORDER — CEFAZOLIN SODIUM-DEXTROSE 2-3 GM-%(50ML) IV SOLR
INTRAVENOUS | Status: DC | PRN
  Administered 2021-11-17: 2 g via INTRAVENOUS

## 2021-11-17 MED ORDER — SODIUM CHLORIDE 0.9% FLUSH
3.0000 mL | Freq: Two times a day (BID) | INTRAVENOUS | Status: DC
  Administered 2021-11-17 – 2021-11-26 (×19): 3 mL via INTRAVENOUS

## 2021-11-17 MED ORDER — METHOCARBAMOL 1000 MG/10ML IJ SOLN: 500.0000 mg | Freq: Four times a day (QID) | INTRAVENOUS | Status: DC | PRN

## 2021-11-17 MED ORDER — ONDANSETRON HCL 4 MG/2ML IJ SOLN
INTRAMUSCULAR | Status: AC
Start: 2021-11-17 — End: ?
  Filled 2021-11-17: qty 2

## 2021-11-17 MED ORDER — OXYCODONE HCL 5 MG/5ML PO SOLN: 5.0000 mg | Freq: Once | ORAL | Status: DC | PRN

## 2021-11-17 MED ORDER — ACETAMINOPHEN 650 MG RE SUPP: 650.0000 mg | RECTAL | Status: DC | PRN

## 2021-11-17 MED ORDER — BUPIVACAINE HCL (PF) 0.5 % IJ SOLN
INTRAMUSCULAR | Status: AC
  Filled 2021-11-17: qty 30

## 2021-11-17 MED ORDER — OXYCODONE HCL 5 MG PO TABS: 5.0000 mg | ORAL_TABLET | Freq: Once | ORAL | Status: DC | PRN

## 2021-11-17 MED ORDER — SUCCINYLCHOLINE CHLORIDE 200 MG/10ML IV SOSY
PREFILLED_SYRINGE | INTRAVENOUS | Status: AC
Start: 2021-11-17 — End: ?
  Filled 2021-11-17: qty 10

## 2021-11-17 MED ORDER — POLYETHYLENE GLYCOL 3350 17 G PO PACK: 17.0000 g | PACK | Freq: Every day | ORAL | Status: DC | PRN

## 2021-11-17 MED ORDER — METHOCARBAMOL 500 MG PO TABS
500.0000 mg | ORAL_TABLET | Freq: Four times a day (QID) | ORAL | Status: DC | PRN
  Administered 2021-11-17: 500 mg via ORAL
  Filled 2021-11-17: qty 1

## 2021-11-17 MED ORDER — PROPOFOL 10 MG/ML IV BOLUS
INTRAVENOUS | Status: DC | PRN
  Administered 2021-11-17: 130 mg via INTRAVENOUS

## 2021-11-17 MED ORDER — ROCURONIUM BROMIDE 10 MG/ML (PF) SYRINGE
PREFILLED_SYRINGE | INTRAVENOUS | Status: DC | PRN
  Administered 2021-11-17: 30 mg via INTRAVENOUS

## 2021-11-17 MED ORDER — PHENOL 1.4 % MT LIQD: 1.0000 | OROMUCOSAL | Status: DC | PRN

## 2021-11-17 MED ORDER — ATORVASTATIN CALCIUM 40 MG PO TABS
40.0000 mg | ORAL_TABLET | Freq: Every day | ORAL | Status: DC
  Administered 2021-11-17 – 2021-11-19 (×3): 40 mg via ORAL
  Filled 2021-11-17 (×3): qty 1

## 2021-11-17 MED ORDER — FENTANYL CITRATE (PF) 100 MCG/2ML IJ SOLN
INTRAMUSCULAR | Status: AC
  Administered 2021-11-17: 50 ug via INTRAVENOUS
  Filled 2021-11-17: qty 2

## 2021-11-17 MED ORDER — EPHEDRINE 5 MG/ML INJ
INTRAVENOUS | Status: AC
Start: 2021-11-17 — End: ?
  Filled 2021-11-17: qty 5

## 2021-11-17 MED ORDER — CHLORHEXIDINE GLUCONATE 0.12 % MT SOLN
15.0000 mL | Freq: Once | OROMUCOSAL | Status: AC
  Administered 2021-11-17: 15 mL via OROMUCOSAL
  Filled 2021-11-17: qty 15

## 2021-11-17 MED ORDER — ACETAMINOPHEN 325 MG PO TABS: 650.0000 mg | ORAL_TABLET | Freq: Four times a day (QID) | ORAL | Status: DC | PRN

## 2021-11-17 MED ORDER — SODIUM CHLORIDE (PF) 0.9 % IJ SOLN
INTRAMUSCULAR | Status: AC
  Filled 2021-11-17: qty 10

## 2021-11-17 MED ORDER — MORPHINE SULFATE (PF) 2 MG/ML IV SOLN
2.0000 mg | INTRAVENOUS | Status: DC | PRN
  Administered 2021-11-18: 2 mg via INTRAVENOUS
  Filled 2021-11-17: qty 1

## 2021-11-17 MED ORDER — METOPROLOL SUCCINATE ER 50 MG PO TB24
50.0000 mg | ORAL_TABLET | Freq: Every day | ORAL | Status: DC
Start: 2021-11-17 — End: 2021-11-19
  Administered 2021-11-17 – 2021-11-19 (×3): 50 mg via ORAL
  Filled 2021-11-17 (×3): qty 1

## 2021-11-17 MED ORDER — ONDANSETRON HCL 4 MG/2ML IJ SOLN
4.0000 mg | Freq: Four times a day (QID) | INTRAMUSCULAR | Status: DC | PRN
  Administered 2021-11-17 – 2021-11-22 (×3): 4 mg via INTRAVENOUS
  Filled 2021-11-17 (×3): qty 2

## 2021-11-17 MED ORDER — SACUBITRIL-VALSARTAN 24-26 MG PO TABS
1.0000 | ORAL_TABLET | Freq: Two times a day (BID) | ORAL | Status: DC
  Administered 2021-11-17 – 2021-11-19 (×4): 1 via ORAL
  Filled 2021-11-17 (×5): qty 1

## 2021-11-17 MED ORDER — ALBUTEROL SULFATE HFA 108 (90 BASE) MCG/ACT IN AERS
INHALATION_SPRAY | RESPIRATORY_TRACT | Status: AC
  Filled 2021-11-17: qty 6.7

## 2021-11-17 MED ORDER — ACETAMINOPHEN 10 MG/ML IV SOLN: 1000.0000 mg | Freq: Once | INTRAVENOUS | Status: DC | PRN

## 2021-11-17 MED ORDER — ACETAMINOPHEN 650 MG RE SUPP: 650.0000 mg | Freq: Four times a day (QID) | RECTAL | Status: DC | PRN

## 2021-11-17 MED ORDER — SENNA 8.6 MG PO TABS
1.0000 | ORAL_TABLET | Freq: Two times a day (BID) | ORAL | Status: DC
  Administered 2021-11-17 – 2021-11-19 (×4): 8.6 mg via ORAL
  Filled 2021-11-17 (×4): qty 1

## 2021-11-17 MED ORDER — MENTHOL 3 MG MT LOZG: 1.0000 | LOZENGE | OROMUCOSAL | Status: DC | PRN

## 2021-11-17 MED ORDER — SUFENTANIL CITRATE 50 MCG/ML IV SOLN
INTRAVENOUS | Status: AC
  Filled 2021-11-17: qty 1

## 2021-11-17 MED ORDER — SODIUM CHLORIDE 0.9 % IV SOLN: INTRAVENOUS | Status: DC

## 2021-11-17 MED ORDER — LACTATED RINGERS IV SOLN: INTRAVENOUS | Status: DC

## 2021-11-17 MED ORDER — ACETAMINOPHEN 500 MG PO TABS: 1000.0000 mg | ORAL_TABLET | Freq: Once | ORAL | Status: DC | PRN

## 2021-11-17 MED ORDER — OXYCODONE HCL 5 MG PO TABS
5.0000 mg | ORAL_TABLET | ORAL | Status: DC | PRN
  Administered 2021-11-19: 5 mg via ORAL
  Filled 2021-11-17: qty 1

## 2021-11-17 MED ORDER — BUPIVACAINE HCL (PF) 0.5 % IJ SOLN
INTRAMUSCULAR | Status: DC | PRN
  Administered 2021-11-17: 3.5 mL

## 2021-11-17 MED ORDER — PROTHROMBIN COMPLEX CONC HUMAN 500 UNITS IV KIT
4092.0000 [IU] | PACK | Status: AC
  Administered 2021-11-17: 4092 [IU] via INTRAVENOUS
  Filled 2021-11-17: qty 4092

## 2021-11-17 MED ORDER — ONDANSETRON HCL 4 MG/2ML IJ SOLN
4.0000 mg | Freq: Once | INTRAMUSCULAR | Status: AC
  Administered 2021-11-17: 4 mg via INTRAVENOUS
  Filled 2021-11-17: qty 2

## 2021-11-17 MED ORDER — MIDAZOLAM HCL 2 MG/2ML IJ SOLN
INTRAMUSCULAR | Status: AC
  Filled 2021-11-17: qty 2

## 2021-11-17 MED ORDER — ONDANSETRON HCL 4 MG/2ML IJ SOLN
INTRAMUSCULAR | Status: DC | PRN
  Administered 2021-11-17: 4 mg via INTRAVENOUS

## 2021-11-17 SURGICAL SUPPLY — 47 items
BAG COUNTER SPONGE SURGICOUNT (BAG) ×2 IMPLANT
BAND RUBBER #18 3X1/16 STRL (MISCELLANEOUS) IMPLANT
BIT DRILL ACP 15 (DRILL) IMPLANT
BIT DRILL NEURO 2X3.1 SFT TUCH (MISCELLANEOUS) ×1 IMPLANT
BNDG GAUZE ELAST 4 BULKY (GAUZE/BANDAGES/DRESSINGS) IMPLANT
BUR BARREL STRAIGHT FLUTE 4.0 (BURR) IMPLANT
CAGE CERV MOD 7X17X14 7D (Cage) ×1 IMPLANT
CANISTER SUCT 3000ML PPV (MISCELLANEOUS) ×2 IMPLANT
DERMABOND ADVANCED (GAUZE/BANDAGES/DRESSINGS) ×1
DERMABOND ADVANCED .7 DNX12 (GAUZE/BANDAGES/DRESSINGS) ×1 IMPLANT
DRAPE LAPAROTOMY 100X72 PEDS (DRAPES) ×2 IMPLANT
DRAPE MICROSCOPE LEICA (MISCELLANEOUS) IMPLANT
DRILL ACP 15 (DRILL) ×2
DRILL NEURO 2X3.1 SOFT TOUCH (MISCELLANEOUS) ×2
DURAPREP 6ML APPLICATOR 50/CS (WOUND CARE) ×2 IMPLANT
ELECT COATED BLADE 2.86 ST (ELECTRODE) ×2 IMPLANT
ELECT REM PT RETURN 9FT ADLT (ELECTROSURGICAL) ×2
ELECTRODE REM PT RTRN 9FT ADLT (ELECTROSURGICAL) ×1 IMPLANT
GAUZE 4X4 16PLY ~~LOC~~+RFID DBL (SPONGE) IMPLANT
GEL DBM PROPEL 1ML (Putty) ×1 IMPLANT
GLOVE BIOGEL PI IND STRL 8.5 (GLOVE) ×1 IMPLANT
GLOVE BIOGEL PI INDICATOR 8.5 (GLOVE) ×1
GLOVE ECLIPSE 8.5 STRL (GLOVE) ×2 IMPLANT
GOWN STRL REUS W/ TWL LRG LVL3 (GOWN DISPOSABLE) IMPLANT
GOWN STRL REUS W/ TWL XL LVL3 (GOWN DISPOSABLE) ×1 IMPLANT
GOWN STRL REUS W/TWL 2XL LVL3 (GOWN DISPOSABLE) ×2 IMPLANT
GOWN STRL REUS W/TWL LRG LVL3 (GOWN DISPOSABLE)
GOWN STRL REUS W/TWL XL LVL3 (GOWN DISPOSABLE) ×1
HALTER HD/CHIN CERV TRACTION D (MISCELLANEOUS) ×2 IMPLANT
HEMOSTAT POWDER KIT SURGIFOAM (HEMOSTASIS) ×2 IMPLANT
KIT BASIN OR (CUSTOM PROCEDURE TRAY) ×2 IMPLANT
NDL SPNL 22GX3.5 QUINCKE BK (NEEDLE) ×1 IMPLANT
NEEDLE HYPO 22GX1.5 SAFETY (NEEDLE) ×2 IMPLANT
NEEDLE SPNL 22GX3.5 QUINCKE BK (NEEDLE) ×2 IMPLANT
NS IRRIG 1000ML POUR BTL (IV SOLUTION) ×2 IMPLANT
PACK LAMINECTOMY NEURO (CUSTOM PROCEDURE TRAY) ×2 IMPLANT
PAD ARMBOARD 7.5X6 YLW CONV (MISCELLANEOUS) ×6 IMPLANT
PATTIES SURGICAL .5 X1 (DISPOSABLE) ×2 IMPLANT
PLATE ACP 1-LEVEL 1.6V20 (Plate) ×1 IMPLANT
SCREW ACP VA ST 3.5X15 (Screw) ×4 IMPLANT
SET WALTER ACTIVATION W/DRAPE (SET/KITS/TRAYS/PACK) ×2 IMPLANT
SPIKE FLUID TRANSFER (MISCELLANEOUS) ×2 IMPLANT
SPONGE INTESTINAL PEANUT (DISPOSABLE) ×2 IMPLANT
SUT VIC AB 4-0 RB1 18 (SUTURE) ×4 IMPLANT
TOWEL GREEN STERILE (TOWEL DISPOSABLE) ×2 IMPLANT
TOWEL GREEN STERILE FF (TOWEL DISPOSABLE) ×2 IMPLANT
WATER STERILE IRR 1000ML POUR (IV SOLUTION) ×2 IMPLANT

## 2021-11-17 NOTE — Op Note (Signed)
Date of surgery: 12-10-2021 Preoperative diagnosis: Herniated nucleus pulposus C5-C6 with quadriplegia, myelopathy Postoperative diagnosis: Same Procedure: Anterior cervical decompression C5-6 arthrodesis with structural spacer allograft anterior plate fixation K2-H0 Surgeon: Barnett Abu Anesthesia: General endotracheal Indications: Stephen Whitney is a 71 year old individual whose had significant neck shoulder arm and back pain and weakness that is developed over the last few days it got to the point where he could not get out of bed yesterday and he was seen in the emergency department where ultimately he was able to have an MRI of the cervical spine obtained this demonstrates presence of a large herniated nucleus pulposus at C5-C6 with cord compression.  He was seen and evaluated in the emergency department advised that he should have emergent surgery as he was markedly weak in the upper and lower extremities.  Patient also has a very poor medical history with history of hepatitis C in addition to other maladies including arthritis and COPd, atrial fibrillation and anticoagulation with Eliquis.  Procedure: Patient was brought to the operating room supine on the stretcher.  After the smooth induction of general tracheal anesthesia, he was placed in 5 pounds of halter traction.  The neck was prepped with alcohol DuraPrep and draped in a sterile fashion.  Transverse incision was made across the left side of the neck and carried down through the platysma the plane between the sternocleidomastoid and the strap muscles dissected bluntly until the prevertebral space was reached.  First identifiable disc space was noted to be that of C5-C6 on a radiograph.  Then by using a Caspari type retractor the C5-6 disc space was exposed completely from left to right and the space was entered with a #15 blade.  A combination of curettes and rongeurs was used to evacuate a substantial quantity of severely degenerated  desiccated disc material.  This was done until the region of the posterior longitudinal ligament was reached and there there was noted to be substantial amounts of disc herniated beyond the area of the ligament into the epidural space.  This was removed in a piecemeal fashion and ultimately portions of the disc that was removed through the ligament itself allowed further opening of the ligament there was disc material immediately against the dura and this was removed in a piecemeal fashion this was fairly fresh disc as it had not been at all fibrosed to the dura itself these large fragments were removed and allowed for good decompression of the common dural tube.  The end the ventral aspect was decompressed fully hemostasis was achieved with some Surgifoam that was irrigated away and then the interspace was sized for an appropriately sized spacer which was noted to be a 7 x 17 x 14 mm spacer with 7 degrees of lordosis this was filled with propel allograft which is demineralized bone matrix then a 20 mm anterior plate was fitted to the ventral aspect of C5-C6 and secured with 3.5 x 15 mm screws.  Final radiograph's of attained and this confirmed good position of the hardware and good alignment of the vertebrae.  With this hemostasis in the ventral aspect of the disc space was obtained and when verified the retractor was removed the platysma was closed with 4-0 Vicryl interrupted fashion and 4-0 Vicryl was used in the subcuticular skin Dermabond was placed on the skin.  Blood loss was estimated less than 50 cc

## 2021-11-17 NOTE — Transfer of Care (Signed)
Immediate Anesthesia Transfer of Care Note  Patient: Stephen Whitney  Procedure(s) Performed: ANTERIOR CERVICAL DECOMPRESSION/DISCECTOMY FUSION  Cervical Five - Cervical Six (Neck)  Patient Location: PACU  Anesthesia Type:General  Level of Consciousness: drowsy and patient cooperative  Airway & Oxygen Therapy: Patient Spontanous Breathing and Patient connected to nasal cannula oxygen  Post-op Assessment: Report given to RN, Post -op Vital signs reviewed and stable and Patient moving all extremities  Post vital signs: Reviewed and stable  Last Vitals:  Vitals Value Taken Time  BP 113/69 11/24/2021 1807  Temp    Pulse 86 11/16/2021 1809  Resp 12 11/14/2021 1810  SpO2 92 % 11/20/2021 1809  Vitals shown include unvalidated device data.  Last Pain:  Vitals:   11/25/2021 1516  TempSrc:   PainSc: 10-Worst pain ever      Patients Stated Pain Goal: 3 (11/13/2021 1516)  Complications: No notable events documented.

## 2021-11-17 NOTE — ED Notes (Signed)
Patient transported to MRI 

## 2021-11-17 NOTE — Consult Note (Addendum)
Reason for Consult: Quadriparesis Referring Physician: Dr. Elvia Collum is an 71 y.o. male.  HPI: Patient is a 71 year old male who has a complicated medical history of atrial fibrillation congestive heart failure osteoarthritis and history of stroke.  He notes that he was able to walk yesterday but was having difficulty moving his hands and became progressively weak to the point where he cannot get out of bed and cannot walk use seen in the emergency department or an MRI of the cervical thoracic and lumbar spines was completed aside from showing some typical spondylitic changes the MRI of the cervical spine shows that he has a sizable disc herniation at C5-C6 level with some cord flattening patient describes numbness down the left side of his body more so than the right he is having difficulty with moving his arms and his hands and cannot grip things he has a history of Dupuytren's contractures but notes that this is only worse now as he is having marked weakness in his hands  Past Medical History:  Diagnosis Date   A-fib ([place])    CHF (congestive heart failure) ([place])    COPD (chronic obstructive pulmonary disease) ([place])    Dupuytren's contracture of both hands    GAD (generalized anxiety disorder)    Hepatitis C    Hypertension    Osteoarthritis    Stroke [facility])     History reviewed. No pertinent surgical history.  History reviewed. No pertinent family history.  Social History:  reports that he has been smoking cigarettes. He has never used smokeless tobacco. He reports current drug use. Drug: Marijuana. He reports that he does not drink alcohol.  Allergies: No Known Allergies  Medications: I have reviewed the patient's current medications.  Results for orders placed or performed during the hospital encounter of 11/16/2021 (from the past 48 hour(s))  CBC with Differential     Status: Abnormal   Collection Time: 11/16/2021  7:56 AM  Result Value Ref Range   WBC 10.8 (H) 4.0 -  10.5 K/uL   RBC 3.62 (L) 4.22 - 5.81 MIL/uL   Hemoglobin 11.7 (L) 13.0 - 17.0 g/dL   HCT 33.1 (L) 39.0 - 52.0 %   MCV 91.4 80.0 - 100.0 fL   MCH 32.3 26.0 - 34.0 pg   MCHC 35.3 30.0 - 36.0 g/dL   RDW 11.9 11.5 - 15.5 %   Platelets 144 (L) 150 - 400 K/uL   nRBC 0.0 0.0 - 0.2 %   Neutrophils Relative % 74 %   Neutro Abs 8.1 (H) 1.7 - 7.7 K/uL   Lymphocytes Relative 16 %   Lymphs Abs 1.7 0.7 - 4.0 K/uL   Monocytes Relative 8 %   Monocytes Absolute 0.9 0.1 - 1.0 K/uL   Eosinophils Relative 1 %   Eosinophils Absolute 0.1 0.0 - 0.5 K/uL   Basophils Relative 0 %   Basophils Absolute 0.0 0.0 - 0.1 K/uL   Immature Granulocytes 1 %   Abs Immature Granulocytes 0.08 (H) 0.00 - 0.07 K/uL    Comment: Performed at [facility], [address] [place], [place] [address]  Comprehensive metabolic panel     Status: Abnormal   Collection Time: 11/16/2021  7:56 AM  Result Value Ref Range   Sodium 135 135 - 145 mmol/L   Potassium 3.5 3.5 - 5.1 mmol/L   Chloride 105 98 - 111 mmol/L   CO2 23 22 - 32 mmol/L   Glucose, Bld 102 (H) 70 -  99 mg/dL    Comment: Glucose reference range applies only to samples taken after fasting for at least 8 hours.   BUN 11 8 - 23 mg/dL   Creatinine, Ser 5.36 0.61 - 1.24 mg/dL   Calcium 7.7 (L) 8.9 - 10.3 mg/dL   Total Protein 4.4 (L) 6.5 - 8.1 g/dL   Albumin 2.8 (L) 3.5 - 5.0 g/dL   AST 14 (L) 15 - 41 U/L   ALT 15 0 - 44 U/L   Alkaline Phosphatase 30 (L) 38 - 126 U/L   Total Bilirubin 0.5 0.3 - 1.2 mg/dL   GFR, Estimated >64 >40 mL/min    Comment: (NOTE) Calculated using the CKD-EPI Creatinine Equation (2021)    Anion gap 7 5 - 15    Comment: Performed at [facility], [address], [place], [place] [address]    MR LUMBAR SPINE WO CONTRAST  Result Date: 2021/12/08 CLINICAL DATA:  71 year old male with neck pain and stiffness. Loss of sensation in arms. Increased neck and back pain this morning. EXAM: MRI LUMBAR SPINE WITHOUT CONTRAST TECHNIQUE:  Multiplanar, multisequence MR imaging of the lumbar spine was performed. No intravenous contrast was administered. COMPARISON:  Thoracic MRI today reported separately. FINDINGS: Segmentation:  Normal, concordant with the thoracic numbering today. Alignment: Relatively normal lumbar lordosis. Subtle retrolisthesis of L1 on L4. Vertebrae: Normal background bone marrow signal. Degenerative endplate marrow signal changes in the lumbar spine with No marrow edema or evidence of acute osseous abnormality. Intact visible sacrum and SI joints. Conus medullaris and cauda equina: Conus extends to the T12 level. Normal conus. And in general the cauda equina nerve roots appear normal. Paraspinal and other soft tissues: Atrophied right kidney (series 26, image 7). Relatively normal partially visible left kidney. Otherwise negative visible abdominal viscera and paraspinal soft tissues. But markedly dilated urinary bladder (series 23, image 10), volume greater than 600 mL. Disc levels: T12-L1: Mild circumferential disc bulge eccentric to the left. No stenosis. L1-L2: Disc space loss with circumferential disc bulging and endplate spurring. Mild epidural lipomatosis. Borderline spinal stenosis. Mild right greater than left L1 foraminal stenosis. L2-L3: Circumferential disc bulge but less epidural lipomatosis. No spinal stenosis. Mild to moderate bilateral L2 foraminal stenosis. L3-L4: Asymmetric circumferential disc bulge with endplate spurring greater on the right. Mild to moderate epidural lipomatosis, facet and ligament flavum hypertrophy. Moderate spinal stenosis and right lateral recess stenosis (series 25, image 24 descending right L4 nerve level). Moderate to severe bilateral L3 foraminal stenosis. L4-L5: Disc space loss. Circumferential disc osteophyte complex with bulky bilateral foraminal involvement. Moderate facet hypertrophy. Less epidural lipomatosis and no significant spinal stenosis. Only mild lateral recess stenosis  greater on the right (L5 nerve levels). Severe bilateral L4 foraminal stenosis. L5-S1: Negative disc. Mild to moderate facet hypertrophy but no significant stenosis. IMPRESSION: 1. Widespread lumbar spine degeneration. Multifactorial Moderate spinal and lateral recess stenosis at L3-L4. Moderate to severe bilateral L3 and L4 nerve level foraminal stenosis at L3-L4. 2. Severe distended urinary bladder, volume > 600 mL. Consider urinary retention. 3. Atrophied right kidney. Electronically Signed   By: Odessa Fleming M.D.   On: 12/08/21 10:49   MR THORACIC SPINE WO CONTRAST  Result Date: 12-08-2021 CLINICAL DATA:  71 year old male with neck pain and stiffness. Loss of sensation in arms. Increased neck and back pain this morning. EXAM: MRI THORACIC SPINE WITHOUT CONTRAST TECHNIQUE: Multiplanar, multisequence MR imaging of the thoracic spine was performed. No intravenous contrast was administered. COMPARISON:  Cervical spine  MRI today reported separately. FINDINGS: Limited cervical spine imaging:  Reported separately Thoracic spine segmentation:  Appears to be normal. Alignment: Relatively normal thoracic kyphosis. No significant spondylolisthesis. Vertebrae: Background bone marrow signal is within normal limits. Mid and lower thoracic endplate spurring. No marrow edema or evidence of acute osseous abnormality. Cord: Above this T3-T4 disc space the upper thoracic spinal cord appears normal. But beginning at T3-T4 and continuing through T9-T10 there is discontinuous abnormal central spinal cord T2 and STIR hyperintensity, circumscribed (series 16, image 21). This appears most pronounced at the T7 level, visible on sagittal T1 weighted imaging there, up to 2-3 mm diameter. There is no associated cord expansion. And the underlying thoracic spinal canal is capacious at most levels. Conus medullaris at T12-L1 appears spared and within normal limits. Paraspinal and other soft tissues: Partially visible right renal atrophy on  series 16, image 39. Otherwise negative visible chest and upper abdominal viscera. Negative thoracic paraspinal soft tissues. Disc levels: T1-T2: Negative. T2-T3: Negative. T3-T4: Negative. T4-T5: Negative. T5-T6: Negative. T6-T7: Disc desiccation and disc space loss. Mild circumferential and slightly lobulated disc bulge. Small left posterior paracentral component. No spinal or foraminal stenosis. T7-T8: Disc desiccation and circumferential disc bulge with a broad-based posterior component. Mild facet hypertrophy greater on the right. No stenosis. T8-T9: Disc desiccation but minimal disc bulging. Mild facet hypertrophy. No significant stenosis. T9-T10: Disc desiccation and mild circumferential disc bulge. Mild facet hypertrophy. No significant stenosis. T10-T11: Disc desiccation. Circumferential disc bulge with mildly lobulated central and right paracentral component of disc (series 16, image 32) superimposed on mild to moderate facet hypertrophy. Borderline spinal stenosis. No cord mass effect. No foraminal stenosis. T11-T12: Circumferential disc bulge eccentric to the left. Mild facet hypertrophy. Borderline spinal stenosis series 16, image 34. And borderline to mild left T11 foraminal stenosis. IMPRESSION: 1. Dominant thoracic spine finding is abnormal cord signal intermittently from T3-T4 through T9-T10 most compatible with syrinx. This is maximal at T7, 2-3 mm diameter. No associated cord expansion or edema. 2. Otherwise mid and lower thoracic disc, endplate, and facet degeneration. Borderline spinal stenosis at both T10-T11 and T11-T12. No cord mass effect. Mild left T11 foraminal stenosis. 3. Atrophied right kidney. Electronically Signed   By: Genevie Ann M.D.   On: 11/01/2021 10:43   MR Cervical Spine Wo Contrast  Result Date: 11/01/2021 CLINICAL DATA:  71 year old male with neck pain and stiffness. Loss of sensation in arms. Increased neck and back pain this morning. EXAM: MRI CERVICAL SPINE WITHOUT  CONTRAST TECHNIQUE: Multiplanar, multisequence MR imaging of the cervical spine was performed. No intravenous contrast was administered. COMPARISON:  Head CT without contrast 11/03/2011. FINDINGS: Alignment: Relatively normal cervical lordosis. No significant spondylolisthesis. Vertebrae: Lower cervical degenerative endplate spurring. No marrow edema or evidence of acute osseous abnormality. Visualized bone marrow signal is within normal limits. Cord: Degenerative spinal cord mass effect, detailed below. No definite spinal cord signal abnormality. Posterior Fossa, vertebral arteries, paraspinal tissues: Cervicomedullary junction is within normal limits. Negative visible posterior fossa. Preserved major vascular flow voids in the neck, the left vertebral artery appears somewhat dominant. Negative bilateral neck soft tissues, lung apices. Disc levels: C2-C3:  Mild disc bulge and facet hypertrophy. No stenosis. C3-C4: Mild disc space loss. Circumferential disc bulge with a broad-based posterior component. Mild facet and ligament flavum hypertrophy. Mild spinal stenosis and spinal cord mass effect (series 8, image 20). Moderate to severe left and mild to moderate right C4 foraminal stenosis. C4-C5: Circumferential disc osteophyte complex with a  broad-based leftward paracentral component visible on series 9, image 25. Mild facet hypertrophy. Mild spinal stenosis and spinal cord mass effect. Mild left and mild-to-moderate right C5 foraminal stenosis. C5-C6: Suboptimal detail of this level but evidence of a fairly large disc extrusion, broad-based at the disc space level (series 9, image 30) and suspected to extend cephalad on the right (series 8, image 29). Up to moderate or severe spinal stenosis suspected here (series 7, image 7). But no cord signal abnormality identified. Multifactorial severe bilateral C6 foraminal stenosis. C6-C7: Disc space loss with circumferential disc osteophyte complex eccentric to the left.  Mild facet and ligament flavum hypertrophy. Mild spinal stenosis. Mild if any cord mass effect. Moderate to severe left greater than right C7 foraminal stenosis. C7-T1: Mild to moderate facet hypertrophy greater on the right. No spinal stenosis. Mild if any right C8 foraminal stenosis. Thoracic spine detailed separately. IMPRESSION: 1. Multilevel degenerative cervical spinal stenosis. Suboptimal anatomic detail at C5-C6 due to motion but suspicious for a relatively large disc herniation with moderate and possibly severe spinal stenosis AND spinal cord mass effect. But no associated cord signal abnormality. Severe bilateral C6 foraminal stenosis. 2. Otherwise chronic cervical spine degeneration with mild spinal stenosis C3-C4 through C6-C7. Up to mild additional cord mass effect but no cord signal abnormality. Associated moderate or severe neural foraminal stenosis at the left C4 and bilateral C7 nerve levels. Electronically Signed   By: Odessa Fleming M.D.   On: 2021-12-11 10:37    Review of Systems  Constitutional:  Positive for activity change.  Musculoskeletal:  Positive for myalgias, neck pain and neck stiffness.  Skin: Negative.   Neurological:  Positive for weakness and numbness.  Hematological: Negative.   Psychiatric/Behavioral: Negative.    All other systems reviewed and are negative.  Blood pressure 103/77, pulse (!) 57, temperature (!) 97.4 F (36.3 C), temperature source Oral, resp. rate 18, SpO2 100 %. Physical Exam Constitutional:      Appearance: He is normal weight.     Comments: Disheveled and poorly kempt  HENT:     Head: Normocephalic and atraumatic.     Right Ear: Tympanic membrane normal.     Left Ear: Tympanic membrane normal.     Nose: Nose normal.     Mouth/Throat:     Mouth: Mucous membranes are moist.     Pharynx: Oropharynx is clear.  Eyes:     Extraocular Movements: Extraocular movements intact.     Conjunctiva/sclera: Conjunctivae normal.     Pupils: Pupils are  equal, round, and reactive to light.  Neck:     Comments: Hard cervical collar in place Cardiovascular:     Rate and Rhythm: Rhythm irregular.     Pulses: Normal pulses.  Pulmonary:     Effort: Pulmonary effort is normal.     Breath sounds: Normal breath sounds.  Abdominal:     General: Abdomen is flat. Bowel sounds are normal.     Palpations: Abdomen is soft.  Musculoskeletal:     Comments: Dupuytren's contractures both hands keeps his arms flexed with marked weakness in the triceps bilaterally  Skin:    General: Skin is warm and dry.     Capillary Refill: Capillary refill takes less than 2 seconds.  Neurological:     Mental Status: He is alert.     Comments: Marked weakness in the proximal arms with 3 out of 5 deltoid 3 out of 5 bicep 2 out of 5 tricep hands that can grip only  1-2 out of 5 he cannot extend his fingers at all lower extremities move spontaneously but he notes to command he has difficulty moving them at all and he notes that they feel markedly numb. No volitional movement can be elicited from any of the lower extremity musculature. cranial nerve examination is normal  Psychiatric:        Mood and Affect: Mood normal.        Behavior: Behavior normal.        Thought Content: Thought content normal.        Judgment: Judgment normal.     Assessment/Plan: Large disc herniation C5-C6 with quadriparesis and marked dysfunction.  Plan he  is to undergo emergent surgical decompression of his cervical spine.  At the C5-C6 level.  Blanchie Dessert Clare Casto 11/03/2021, 12:04 PM

## 2021-11-17 NOTE — Progress Notes (Signed)
Wife and Daughter asked if nurse could call [employer] to see if we could help in getting his son [place] Herskowitz home from [place].  [employer] # [phone] with case # [id number].  Passed info to Public relations account executive.

## 2021-11-17 NOTE — H&P (Signed)
Date: 11/16/2021               Patient Name:  Stephen Whitney MRN: [id number]  DOB: 1950/10/18 Age / Sex: 71 y.o., male   PCP: Pcp, No         Medical Service: Internal Medicine Teaching Service         Attending Physician: Dr. Charise Killian, MD    First Contact: Dr. Marlou Sa Pager: [phone]  Second Contact: Dr. Collene Gobble Pager: [phone]       After Hours (After 5p/  First Contact Pager: [phone]  weekends / holidays): Second Contact Pager: [phone]   Chief Complaint: quadriparesis  History of Present Illness: Gloyd Happ is a 71 y.o. M with pertinent PMH of CVA, MI, CHF, COPD, osteoarthritis, HLD who presents with paraplegia and decreased sensation of all limbs noted on waking up this morning.   He states that over the last week or so he has had increased numbness, tingling, and weakness in his arms and legs, as well as worsened pain in his back and neck. This morning when he went to get up from bed to use the restroom his knees "didn't work." He reports having near-total loss of sensation over his whole body with the exception of the spine and neck at this time. He also reports some burning sensations over his arms, like his arm is "on fire," over this time as well. He is unable to move bilateral LE despite actively attempting to do so. He has severe pain in his back and neck. He has never had anything like this before. No recent traumas or injuries.  ED Course: CBC with mild leukocytosis of 10.8, normocytic anemia with Hgb 11.7, thrombocytopenia 144 with neutrophil predominance at 8.1; CMP with hypocalcemia 7.7, hypoproteinemia 4.4, hypoalbuminemia 2.8, low AST 14, low alk phos 30. MRI most concerning for spinal cord compression in C spine. Neurosurgery consulted.  Meds:  Current Meds  Medication Sig   apixaban (ELIQUIS) 5 MG TABS tablet Take 5 mg by mouth 2 (two) times daily.   atorvastatin (LIPITOR) 40 MG tablet Take 40 mg by mouth daily.   furosemide (LASIX) 20 MG tablet Take  20 mg by mouth daily as needed for fluid or edema.   methocarbamol (ROBAXIN) 500 MG tablet Take 500 mg by mouth 3 (three) times daily as needed for muscle spasms.   metoprolol succinate (TOPROL-XL) 50 MG 24 hr tablet Take 50 mg by mouth daily. Take with or immediately following a meal.   sacubitril-valsartan (ENTRESTO) 24-26 MG Take 1 tablet by mouth 2 (two) times daily.     Allergies: Allergies as of 11/28/2021   (No Known Allergies)   Past Medical History:  Diagnosis Date   A-fib ([place])    CHF (congestive heart failure) (HCC)    COPD (chronic obstructive pulmonary disease) ([place])    Dupuytren's contracture of both hands    GAD (generalized anxiety disorder)    Hepatitis C    History of atrial fibrillation 11/23/2021   History of CVA (cerebrovascular accident) 11/10/2021   History of MI (myocardial infarction) 11/10/2021   Hypertension    Osteoarthritis    Stroke [facility])     Family History: Extensive history of CV disease in the family.  Social History: Patient stopped smoking 40 years ago after suffering from an MI. He occasionally drinks beer. No other substances. He is independent in ADLs/iADLs. His PCP is in [place]. Prior to recent functional decline preceding today's symptoms,  he was relatively active.   Review of Systems: A complete ROS was negative except as per HPI.   Physical Exam: Blood pressure (!) 107/41, pulse 65, temperature (!) 97.4 F (36.3 C), temperature source Oral, resp. rate 17, weight 83 kg, SpO2 96 %. Constitutional:Elderly gentleman in C-collar laying in bed, looks very uncomfortable. Cardio:Regular rate and rhythm. Pulm:Increased diaphragmatic excursion during exhalation. Comfortable on room air. Abdomen:Soft, nontender, nondistended. MSK:No pitting edema noted.  Skin:Skin is warm and dry with exception of distal bilateral LE which are cool to touch. Sunburn present over anterior L forearm. Neuro: Mental Status: Patient is awake, alert, oriented  x3 No signs of aphasia or neglect Cranial Nerves: III,IV, VI: EOMI without ptosis or diploplia.  V: Facial sensation is symmetric to light touch and temperature. VII: Facial movement is symmetric.  VIII: Hearing is intact to voice XI: Shoulder shrug is symmetric.  Motor: 0/5 bilateral LE, 4/5 bilateral UE. Both hands are in fists.  Sensory: Sensation is not intact in bilateral LE with decreased sensation to UE R>L. Plantars: Toes are upgoing on R; no movement on L. Psych:Anxious mood and affect.  MRI C spine: 1. Multilevel degenerative cervical spinal stenosis. Suboptimal anatomic detail at C5-C6 due to motion but suspicious for a relatively large disc herniation with moderate and possibly severe spinal stenosis AND spinal cord mass effect. But no associated cord signal abnormality. Severe bilateral C6 foraminal stenosis. 2. Otherwise chronic cervical spine degeneration with mild spinal stenosis C3-C4 through C6-C7. Up to mild additional cord mass effect but no cord signal abnormality. Associated moderate or severe neural foraminal stenosis at the left C4 and bilateral C7 nerve levels.  MRI T spine: 1. Dominant thoracic spine finding is abnormal cord signal intermittently from T3-T4 through T9-T10 most compatible with syrinx. This is maximal at T7, 2-3 mm diameter. No associated cord expansion or edema. 2. Otherwise mid and lower thoracic disc, endplate, and facet degeneration. Borderline spinal stenosis at both T10-T11 and T11-T12. No cord mass effect. Mild left T11 foraminal stenosis. 3. Atrophied right kidney.  MRI L spine: 1. Widespread lumbar spine degeneration. Multifactorial Moderate spinal and lateral recess stenosis at L3-L4. Moderate to severe bilateral L3 and L4 nerve level foraminal stenosis at L3-L4. 2. Severe distended urinary bladder, volume > 600 mL. Consider urinary retention. 3. Atrophied right kidney.    Assessment & Plan by Problem: Principal Problem:   Spinal  cord compression (HCC) Active Problems:   History of atrial fibrillation   Osteoarthritis   CHF (congestive heart failure) (HCC)   Hypertension  C5-C6 spinal cord compression Osteoarthritis Patient presents with 1 week of progressive neurological symptoms, now with paraplegia, reduced strength in bilateral UE, and altered sensation over his entire body. Imaging findings are concerning for emergent C5-C6 compression. -Neurosurgery consulted, appreciate their assistance  -Emergent decompression of spinal cord today around 1500 -PT/OT consult when appropriate  Hypertension Patient is normotensive at this time. Managed outpatient with metoprolol. -Hold home metoprolol; resume if indicated  Hx atrial fibrillation Managed outpatient with Eliquis. -Resume Eliquis after procedure  Hx HFrEF Patient reports initial LV EF 25-30%, with recent echocardiogram showing improvement to 55-60%. Managed outpatient with Lasix, Entresto. Does not appear volume overloaded on exam, however distal bilateral LE are cool to touch concerning for either low output or some component of autonomic dysfunction given cervical spinal cord compression. -Resume Lasix, Entresto after procedure  Hx CVA Hx HLD -Resume atorvastatin after procedure  Dispo: Admit patient to Inpatient with expected length of stay  greater than 2 midnights.  Signed: Farrel Gordon, DO 11/22/2021, 1:07 PM  Pager: [phone] After 5pm on weekdays and 1pm on weekends: On Call pager: [phone]

## 2021-11-17 NOTE — Anesthesia Preprocedure Evaluation (Addendum)
Anesthesia Evaluation  Patient identified by MRN, date of birth, ID band Patient awake    Reviewed: Allergy & Precautions, NPO status , Patient's Chart, lab work & pertinent test results  History of Anesthesia Complications Negative for: history of anesthetic complications  Airway Mallampati: III  TM Distance: >3 FB Neck ROM: Limited  Mouth opening: Limited Mouth Opening  Dental  (+) Poor Dentition, Chipped, Missing   Pulmonary shortness of breath, COPD, Current Smoker and Patient abstained from smoking.,     + wheezing      Cardiovascular hypertension, Pt. on medications (-) angina+CHF  (-) Past MI + dysrhythmias Atrial Fibrillation  Rhythm:Regular  LeftVentricle: The calculated left ventricular ejection fraction is  57%.  . LeftVentricle: Wall thickness is normal.  . LeftVentricle: Systolic function is normal. EF: 55-60%.  . LeftVentricle: Doppler parameters consistent with mild diastolic  dysfunction and low to normal LA pressure.  . LeftVentricle: Wall motion is normal.  . AorticValve: Trace to mild aortic valve regurgitation.  . Pericardium: There is a trivial pericardial effusion noted.    Neuro/Psych PSYCHIATRIC DISORDERS Anxiety Quadriplegic  Neuromuscular disease CVA    GI/Hepatic (+) Hepatitis -, C  Endo/Other    Renal/GU      Musculoskeletal  (+) Arthritis ,   Abdominal   Peds  Hematology   Anesthesia Other Findings   Reproductive/Obstetrics                            Anesthesia Physical Anesthesia Plan  ASA: 4 and emergent  Anesthesia Plan: General   Post-op Pain Management: Ofirmev IV (intra-op)*   Induction: Intravenous  PONV Risk Score and Plan: 1 and Ondansetron and Dexamethasone  Airway Management Planned: Oral ETT and Video Laryngoscope Planned  Additional Equipment: None  Intra-op Plan:   Post-operative Plan: Extubation in OR and Possible  Post-op intubation/ventilation  Informed Consent: I have reviewed the patients History and Physical, chart, labs and discussed the procedure including the risks, benefits and alternatives for the proposed anesthesia with the patient or authorized representative who has indicated his/her understanding and acceptance.     Dental advisory given  Plan Discussed with: CRNA  Anesthesia Plan Comments:        Anesthesia Quick Evaluation

## 2021-11-17 NOTE — Anesthesia Postprocedure Evaluation (Signed)
Anesthesia Post Note  Patient: Stephen Whitney  Procedure(s) Performed: ANTERIOR CERVICAL DECOMPRESSION/DISCECTOMY FUSION  Cervical Five - Cervical Six (Neck)     Patient location during evaluation: PACU Anesthesia Type: General Level of consciousness: awake and alert, patient cooperative and oriented Pain management: pain level controlled Vital Signs Assessment: post-procedure vital signs reviewed and stable Respiratory status: spontaneous breathing, nonlabored ventilation, respiratory function stable and patient connected to nasal cannula oxygen Cardiovascular status: blood pressure returned to baseline and stable Postop Assessment: no apparent nausea or vomiting Anesthetic complications: no   No notable events documented.  Last Vitals:  Vitals:   11/16/2021 1823 11/11/2021 1838  BP: 116/83 126/73  Pulse: 85 86  Resp: 15 14  Temp:  36.9 C  SpO2: 97%     Last Pain:  Vitals:   11/14/2021 1823  TempSrc:   PainSc: 0-No pain    LLE Motor Response: Purposeful movement (11/08/2021 1838)   RLE Motor Response: Purposeful movement (11/07/2021 1838)        Erling Cruz. Breeonna Mone

## 2021-11-17 NOTE — Anesthesia Procedure Notes (Signed)
Procedure Name: Intubation Date/Time: 11/05/2021 4:22 PM  Performed by: Shireen Quan, CRNAPre-anesthesia Checklist: Patient identified, Emergency Drugs available, Suction available and Patient being monitored Patient Re-evaluated:Patient Re-evaluated prior to induction Oxygen Delivery Method: Circle System Utilized Preoxygenation: Pre-oxygenation with 100% oxygen Induction Type: IV induction Ventilation: Mask ventilation without difficulty Laryngoscope Size: Glidescope and 4 Grade View: Grade II Tube type: Oral Tube size: 7.5 mm Number of attempts: 1 Airway Equipment and Method: Rigid stylet and Video-laryngoscopy (Laryngoscopy and intubation accomplished with no movement of neck. Rigid cervical collar in place throughout airway management.) Placement Confirmation: ETT inserted through vocal cords under direct vision, positive ETCO2 and breath sounds checked- equal and bilateral Secured at: 24 cm Tube secured with: Tape Dental Injury: Teeth and Oropharynx as per pre-operative assessment

## 2021-11-17 NOTE — ED Triage Notes (Signed)
Patient has been having issues with neck pain and stiffness.  Patient was seen at [facility] and told has arthritis then seen at PCP then Ortho.  [facility] has [facility] pain in neck and back and this mornig complains of numbness weakness in upper extremities but when he got up to go to bathroom his knees buckled.  Initial pressure low so EMS gave 600cc NS and fentanyl .  Patient is currently in c collar.

## 2021-11-17 NOTE — ED Provider Notes (Signed)
[facility] EMERGENCY DEPARTMENT Provider Note   CSN: [id number] Arrival date & time: 12/04/2021  6378     History  Chief Complaint  Patient presents with   Torticollis    Mayson RASHAD AULD is a 71 y.o. male.  Patient presents with chief complaint of bilateral leg weakness and neck pain.  Neck pain ongoing for the last week or so.  He had seen in urgent care and had x-rays done and was sent to his primary care doctor would refer him to orthopedics.  Patient was walking at home today when his legs gave out and he sat himself down onto the ground but could not get up due to continued weakness in his legs.  Denies any traumatic fall per patient denies head injury.  Complaining of neck pain and weakness.  Denies headache denies nausea denies vomiting.       Home Medications Prior to Admission medications   Not on File      Allergies    Patient has no known allergies.    Review of Systems   Review of Systems  Constitutional:  Negative for fever.  HENT:  Negative for ear pain and sore throat.   Eyes:  Negative for pain.  Respiratory:  Negative for cough.   Cardiovascular:  Negative for chest pain.  Gastrointestinal:  Negative for abdominal pain.  Genitourinary:  Negative for flank pain.  Musculoskeletal:  Negative for back pain.  Skin:  Negative for color change and rash.  Neurological:  Negative for syncope.  All other systems reviewed and are negative.   Physical Exam Updated Vital Signs BP 103/77 (BP Location: Left Arm)   Pulse (!) 57   Temp (!) 97.4 F (36.3 C) (Oral)   Resp 18   SpO2 100%  Physical Exam Constitutional:      Appearance: He is well-developed.  HENT:     Head: Normocephalic.     Nose: Nose normal.  Eyes:     Extraocular Movements: Extraocular movements intact.  Cardiovascular:     Rate and Rhythm: Normal rate.  Pulmonary:     Effort: Pulmonary effort is normal.  Skin:    Coloration: Skin is not jaundiced.  Neurological:      Mental Status: He is alert.     Comments: 4/5 strength bilateral upper extremities, patient has loss of sensation from upper chest down to his toes.  Bilateral loss of sensation in this region.  0/5 strength to bilateral lower extremities.  No sensation in the saddle region.     ED Results / Procedures / Treatments   Labs (all labs ordered are listed, but only abnormal results are displayed) Labs Reviewed  CBC WITH DIFFERENTIAL/PLATELET - Abnormal; Notable for the following components:      Result Value   WBC 10.8 (*)    RBC 3.62 (*)    Hemoglobin 11.7 (*)    HCT 33.1 (*)    Platelets 144 (*)    Neutro Abs 8.1 (*)    Abs Immature Granulocytes 0.08 (*)    All other components within normal limits  COMPREHENSIVE METABOLIC PANEL - Abnormal; Notable for the following components:   Glucose, Bld 102 (*)    Calcium 7.7 (*)    Total Protein 4.4 (*)    Albumin 2.8 (*)    AST 14 (*)    Alkaline Phosphatase 30 (*)    All other components within normal limits    EKG None  Radiology MR LUMBAR SPINE WO CONTRAST  Result Date: November 28, 2021 CLINICAL DATA:  71 year old male with neck pain and stiffness. Loss of sensation in arms. Increased neck and back pain this morning. EXAM: MRI LUMBAR SPINE WITHOUT CONTRAST TECHNIQUE: Multiplanar, multisequence MR imaging of the lumbar spine was performed. No intravenous contrast was administered. COMPARISON:  Thoracic MRI today reported separately. FINDINGS: Segmentation:  Normal, concordant with the thoracic numbering today. Alignment: Relatively normal lumbar lordosis. Subtle retrolisthesis of L1 on L4. Vertebrae: Normal background bone marrow signal. Degenerative endplate marrow signal changes in the lumbar spine with No marrow edema or evidence of acute osseous abnormality. Intact visible sacrum and SI joints. Conus medullaris and cauda equina: Conus extends to the T12 level. Normal conus. And in general the cauda equina nerve roots appear normal.  Paraspinal and other soft tissues: Atrophied right kidney (series 26, image 7). Relatively normal partially visible left kidney. Otherwise negative visible abdominal viscera and paraspinal soft tissues. But markedly dilated urinary bladder (series 23, image 10), volume greater than 600 mL. Disc levels: T12-L1: Mild circumferential disc bulge eccentric to the left. No stenosis. L1-L2: Disc space loss with circumferential disc bulging and endplate spurring. Mild epidural lipomatosis. Borderline spinal stenosis. Mild right greater than left L1 foraminal stenosis. L2-L3: Circumferential disc bulge but less epidural lipomatosis. No spinal stenosis. Mild to moderate bilateral L2 foraminal stenosis. L3-L4: Asymmetric circumferential disc bulge with endplate spurring greater on the right. Mild to moderate epidural lipomatosis, facet and ligament flavum hypertrophy. Moderate spinal stenosis and right lateral recess stenosis (series 25, image 24 descending right L4 nerve level). Moderate to severe bilateral L3 foraminal stenosis. L4-L5: Disc space loss. Circumferential disc osteophyte complex with bulky bilateral foraminal involvement. Moderate facet hypertrophy. Less epidural lipomatosis and no significant spinal stenosis. Only mild lateral recess stenosis greater on the right (L5 nerve levels). Severe bilateral L4 foraminal stenosis. L5-S1: Negative disc. Mild to moderate facet hypertrophy but no significant stenosis. IMPRESSION: 1. Widespread lumbar spine degeneration. Multifactorial Moderate spinal and lateral recess stenosis at L3-L4. Moderate to severe bilateral L3 and L4 nerve level foraminal stenosis at L3-L4. 2. Severe distended urinary bladder, volume > 600 mL. Consider urinary retention. 3. Atrophied right kidney. Electronically Signed   By: Odessa Fleming M.D.   On: 11-28-21 10:49   MR THORACIC SPINE WO CONTRAST  Result Date: 11/28/2021 CLINICAL DATA:  71 year old male with neck pain and stiffness. Loss of  sensation in arms. Increased neck and back pain this morning. EXAM: MRI THORACIC SPINE WITHOUT CONTRAST TECHNIQUE: Multiplanar, multisequence MR imaging of the thoracic spine was performed. No intravenous contrast was administered. COMPARISON:  Cervical spine MRI today reported separately. FINDINGS: Limited cervical spine imaging:  Reported separately Thoracic spine segmentation:  Appears to be normal. Alignment: Relatively normal thoracic kyphosis. No significant spondylolisthesis. Vertebrae: Background bone marrow signal is within normal limits. Mid and lower thoracic endplate spurring. No marrow edema or evidence of acute osseous abnormality. Cord: Above this T3-T4 disc space the upper thoracic spinal cord appears normal. But beginning at T3-T4 and continuing through T9-T10 there is discontinuous abnormal central spinal cord T2 and STIR hyperintensity, circumscribed (series 16, image 21). This appears most pronounced at the T7 level, visible on sagittal T1 weighted imaging there, up to 2-3 mm diameter. There is no associated cord expansion. And the underlying thoracic spinal canal is capacious at most levels. Conus medullaris at T12-L1 appears spared and within normal limits. Paraspinal and other soft tissues: Partially visible right renal atrophy on series 16, image 39. Otherwise negative visible chest and  upper abdominal viscera. Negative thoracic paraspinal soft tissues. Disc levels: T1-T2: Negative. T2-T3: Negative. T3-T4: Negative. T4-T5: Negative. T5-T6: Negative. T6-T7: Disc desiccation and disc space loss. Mild circumferential and slightly lobulated disc bulge. Small left posterior paracentral component. No spinal or foraminal stenosis. T7-T8: Disc desiccation and circumferential disc bulge with a broad-based posterior component. Mild facet hypertrophy greater on the right. No stenosis. T8-T9: Disc desiccation but minimal disc bulging. Mild facet hypertrophy. No significant stenosis. T9-T10: Disc  desiccation and mild circumferential disc bulge. Mild facet hypertrophy. No significant stenosis. T10-T11: Disc desiccation. Circumferential disc bulge with mildly lobulated central and right paracentral component of disc (series 16, image 32) superimposed on mild to moderate facet hypertrophy. Borderline spinal stenosis. No cord mass effect. No foraminal stenosis. T11-T12: Circumferential disc bulge eccentric to the left. Mild facet hypertrophy. Borderline spinal stenosis series 16, image 34. And borderline to mild left T11 foraminal stenosis. IMPRESSION: 1. Dominant thoracic spine finding is abnormal cord signal intermittently from T3-T4 through T9-T10 most compatible with syrinx. This is maximal at T7, 2-3 mm diameter. No associated cord expansion or edema. 2. Otherwise mid and lower thoracic disc, endplate, and facet degeneration. Borderline spinal stenosis at both T10-T11 and T11-T12. No cord mass effect. Mild left T11 foraminal stenosis. 3. Atrophied right kidney. Electronically Signed   By: Odessa Fleming M.D.   On: 11/11/2021 10:43   MR Cervical Spine Wo Contrast  Result Date: 11/21/2021 CLINICAL DATA:  71 year old male with neck pain and stiffness. Loss of sensation in arms. Increased neck and back pain this morning. EXAM: MRI CERVICAL SPINE WITHOUT CONTRAST TECHNIQUE: Multiplanar, multisequence MR imaging of the cervical spine was performed. No intravenous contrast was administered. COMPARISON:  Head CT without contrast 11/03/2011. FINDINGS: Alignment: Relatively normal cervical lordosis. No significant spondylolisthesis. Vertebrae: Lower cervical degenerative endplate spurring. No marrow edema or evidence of acute osseous abnormality. Visualized bone marrow signal is within normal limits. Cord: Degenerative spinal cord mass effect, detailed below. No definite spinal cord signal abnormality. Posterior Fossa, vertebral arteries, paraspinal tissues: Cervicomedullary junction is within normal limits. Negative  visible posterior fossa. Preserved major vascular flow voids in the neck, the left vertebral artery appears somewhat dominant. Negative bilateral neck soft tissues, lung apices. Disc levels: C2-C3:  Mild disc bulge and facet hypertrophy. No stenosis. C3-C4: Mild disc space loss. Circumferential disc bulge with a broad-based posterior component. Mild facet and ligament flavum hypertrophy. Mild spinal stenosis and spinal cord mass effect (series 8, image 20). Moderate to severe left and mild to moderate right C4 foraminal stenosis. C4-C5: Circumferential disc osteophyte complex with a broad-based leftward paracentral component visible on series 9, image 25. Mild facet hypertrophy. Mild spinal stenosis and spinal cord mass effect. Mild left and mild-to-moderate right C5 foraminal stenosis. C5-C6: Suboptimal detail of this level but evidence of a fairly large disc extrusion, broad-based at the disc space level (series 9, image 30) and suspected to extend cephalad on the right (series 8, image 29). Up to moderate or severe spinal stenosis suspected here (series 7, image 7). But no cord signal abnormality identified. Multifactorial severe bilateral C6 foraminal stenosis. C6-C7: Disc space loss with circumferential disc osteophyte complex eccentric to the left. Mild facet and ligament flavum hypertrophy. Mild spinal stenosis. Mild if any cord mass effect. Moderate to severe left greater than right C7 foraminal stenosis. C7-T1: Mild to moderate facet hypertrophy greater on the right. No spinal stenosis. Mild if any right C8 foraminal stenosis. Thoracic spine detailed separately. IMPRESSION: 1. Multilevel degenerative  cervical spinal stenosis. Suboptimal anatomic detail at C5-C6 due to motion but suspicious for a relatively large disc herniation with moderate and possibly severe spinal stenosis AND spinal cord mass effect. But no associated cord signal abnormality. Severe bilateral C6 foraminal stenosis. 2. Otherwise chronic  cervical spine degeneration with mild spinal stenosis C3-C4 through C6-C7. Up to mild additional cord mass effect but no cord signal abnormality. Associated moderate or severe neural foraminal stenosis at the left C4 and bilateral C7 nerve levels. Electronically Signed   By: Odessa Fleming M.D.   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     Medications Ordered in ED Medications  morphine (PF) 4 MG/ML injection 4 mg (4 mg Intravenous Given 2021/11/24 0853)  ondansetron (ZOFRAN) injection 4 mg (4 mg Intravenous Given November 24, 2021 6606)    ED Course/ Medical Decision Making/ A&P                           Medical Decision Making Amount and/or Complexity of Data Reviewed Labs: ordered. Radiology: ordered.  Risk Prescription drug management.   Review of record shows office visit Oct 10, 2021 for paroxysmal atrial fibrillation.  Cardiac monitoring showing sinus rhythm.  History obtained from family at bedside.  Dina studies Labs are unremarkable.  Imaging is concerning for C5 and 6 large disc protrusion with severe canal stenosis and mass effect.  Given his symptoms and findings, neurosurgical consultation requested.  Spoke with Dr.Elsner, or operative intervention this evening.  Hospitalist consulted for admission.          Final Clinical Impression(s) / ED Diagnoses Final diagnoses:  Cervical stenosis of spine    Rx / DC Orders ED Discharge Orders     None         Cheryll Cockayne, MD 11-24-21 1158

## 2021-11-18 DIAGNOSIS — G9520 Unspecified cord compression: Secondary | ICD-10-CM | POA: Diagnosis not present

## 2021-11-18 LAB — CBC
HCT: 34.3 % — ABNORMAL LOW (ref 39.0–52.0)
HCT: 30.8 % — ABNORMAL LOW (ref 39.0–52.0)
Hemoglobin: 12.3 g/dL — ABNORMAL LOW (ref 13.0–17.0)
Hemoglobin: 11.1 g/dL — ABNORMAL LOW (ref 13.0–17.0)
MCH: 32.3 pg (ref 26.0–34.0)
MCH: 32.7 pg (ref 26.0–34.0)
MCHC: 35.9 g/dL (ref 30.0–36.0)
MCHC: 36 g/dL (ref 30.0–36.0)
MCV: 90 fL (ref 80.0–100.0)
MCV: 90.9 fL (ref 80.0–100.0)
Platelets: 149 10*3/uL — ABNORMAL LOW (ref 150–400)
Platelets: 124 10*3/uL — ABNORMAL LOW (ref 150–400)
RBC: 3.81 MIL/uL — ABNORMAL LOW (ref 4.22–5.81)
RBC: 3.39 MIL/uL — ABNORMAL LOW (ref 4.22–5.81)
RDW: 11.9 % (ref 11.5–15.5)
RDW: 12 % (ref 11.5–15.5)
WBC: 17 10*3/uL — ABNORMAL HIGH (ref 4.0–10.5)
WBC: 16.9 10*3/uL — ABNORMAL HIGH (ref 4.0–10.5)
nRBC: 0 % (ref 0.0–0.2)
nRBC: 0 % (ref 0.0–0.2)

## 2021-11-18 LAB — COMPREHENSIVE METABOLIC PANEL
ALT: 19 U/L (ref 0–44)
AST: 19 U/L (ref 15–41)
Albumin: 3.1 g/dL — ABNORMAL LOW (ref 3.5–5.0)
Alkaline Phosphatase: 33 U/L — ABNORMAL LOW (ref 38–126)
Anion gap: 11 (ref 5–15)
BUN: 17 mg/dL (ref 8–23)
CO2: 24 mmol/L (ref 22–32)
Calcium: 8.8 mg/dL — ABNORMAL LOW (ref 8.9–10.3)
Chloride: 100 mmol/L (ref 98–111)
Creatinine, Ser: 1.2 mg/dL (ref 0.61–1.24)
GFR, Estimated: >60 mL/min (ref >60)
Glucose, Bld: 169 mg/dL — ABNORMAL HIGH (ref 70–99)
Potassium: 4 mmol/L (ref 3.5–5.1)
Sodium: 135 mmol/L (ref 135–145)
Total Bilirubin: 1.1 mg/dL (ref 0.3–1.2)
Total Protein: 5.3 g/dL — ABNORMAL LOW (ref 6.5–8.1)

## 2021-11-18 LAB — HIV ANTIBODY (ROUTINE TESTING W REFLEX): HIV Screen 4th Generation wRfx: NONREACTIVE

## 2021-11-18 MED ORDER — PROMETHAZINE HCL 25 MG/ML IJ SOLN: 25.0000 mg | INTRAMUSCULAR | Status: DC

## 2021-11-18 MED ORDER — PROMETHAZINE HCL 25 MG/ML IJ SOLN
25.0000 mg | INTRAMUSCULAR | Status: DC | PRN
Start: 2021-11-18 — End: 2021-11-27
  Administered 2021-11-18 (×2): 25 mg via INTRAMUSCULAR
  Filled 2021-11-18 (×3): qty 1

## 2021-11-18 MED ORDER — APIXABAN 5 MG PO TABS
5.0000 mg | ORAL_TABLET | Freq: Two times a day (BID) | ORAL | Status: DC
  Administered 2021-11-18 – 2021-11-19 (×2): 5 mg via ORAL
  Filled 2021-11-18 (×2): qty 1

## 2021-11-18 NOTE — Plan of Care (Signed)

## 2021-11-18 NOTE — Plan of Care (Signed)
  Problem: Education: Goal: Knowledge of General Education information will improve Description: Including pain rating scale, medication(s)/side effects and non-pharmacologic comfort measures Outcome: Progressing   Problem: Health Behavior/Discharge Planning: Goal: Ability to manage health-related needs will improve Outcome: Progressing   Problem: Clinical Measurements: Goal: Ability to maintain clinical measurements within normal limits will improve Outcome: Progressing Goal: Will remain free from infection Outcome: Progressing Goal: Respiratory complications will improve Outcome: Progressing   Problem: Nutrition: Goal: Adequate nutrition will be maintained Outcome: Progressing   Problem: Pain Managment: Goal: General experience of comfort will improve Outcome: Progressing   Problem: Safety: Goal: Ability to remain free from injury will improve Outcome: Progressing

## 2021-11-18 NOTE — Progress Notes (Signed)
Was vomiting small amounts of coffee ground emesis at the beginning of the shift during bedside report and shift exchange.  Noc nurse said he had been doing so off and on throughout the shift and MD was notified.  Phenergan IM was ordered as a supplement to the zofran.  I also found out that RR had been called during night shift as well and they came up to assess him overnight as well.  His abdomen was distended and he had not voided and was unable to void on his own.  His scan showed > 1000 and he was I&O cathed for 1800.  After his bladder was emptied, he was calmer and said he instantly felt better but he was unable to feel or tell when he needs to void.  MD was notified and did come up to see pt when was notified that RR was called and he is still vomiting and has some SOB and feels really badly.  Was unable to cough up secretions and resp were rhonchus. MD ordered a foley to be placed.  Charge nurse was notified and foley was placed.  Had 1100 more out per cath.  Now said he feels much better and is able to cough much better.  Lungs are clearer.  Was moved to room 3W 21 to be closer to nurses station.

## 2021-11-18 NOTE — Progress Notes (Signed)
Patient ID: Stephen Whitney, male   DOB: May 25, 1951, 71 y.o.   MRN: [id number] Vital signs are stable Patient has been having some coffee-ground emesis He was found to have an overly extended bladder with 1800 cc of urine This could be contributing to his marked discomfort He will likely need a Foley catheter secondary to his neurologic status I believe he would benefit from rehabilitation medicine consult and would be a good candidate for inpatient rehabilitation secondary to new deficits with his lower extremities and his upper extremities As regards his Eliquis I believe that this can be restarted 24 hours after his surgical intervention  I do not believe he needs to stay on high-dose steroids at this time Incision on his neck appears dry with minimal swelling underneath. Neurosurgery will continue to follow over the next few days.

## 2021-11-18 NOTE — Progress Notes (Signed)
HD#1 SUBJECTIVE:  Patient Summary:  Stephen Whitney is a 71 y.o. M with pertinent PMH of CVA, MI, CHF, COPD, osteoarthritis, HLD who presents with paraplegia and hypoesthesia and admitted for C5-C6 spinal cord compression requiring emergent decompression.   Overnight Events: In/out cath with 1800 cc urine.  Interim History: Patient is very nauseated this morning.Overnight he was able to tolerate a diet without vomiting however this morning he has no appetite. Pain is significantly improved, now just an ache.  OBJECTIVE:  Vital Signs: Vitals:   12-15-2021 2138 11/18/21 0159 11/18/21 0442 11/18/21 0852  BP: 115/76 (!) 148/88 120/76 110/70  Pulse: 80 94 88 83  Resp: 16 18 18 18   Temp: 97.6 F (36.4 C) 98.4 F (36.9 C) 98.4 F (36.9 C) 98.9 F (37.2 C)  TempSrc: Oral Oral Oral Oral  SpO2: 99% 98% 97% 100%  Weight:      Height:       Supplemental O2: Room Air SpO2: 100 % O2 Flow Rate (L/min): 2 L/min  Filed Weights   Dec 15, 2021 1302 12-15-21 1310  Weight: 83 kg 83 kg     Intake/Output Summary (Last 24 hours) at 11/18/2021 0934 Last data filed at 11/18/2021 0313 Gross per 24 hour  Intake 1198.16 ml  Output 50 ml  Net 1148.16 ml   Net IO Since Admission: 1,148.16 mL [11/18/21 0934]  Physical Exam: Constitutional:Acutely ill gentleman resting upright in bed, dry heaving at times. HENT: Closed surgical incision without increased warmth or erythema over anterior neck. Cardio:Regular rate and rhythm. Pulm:Transmitted upper airway sounds with poor cough effort. Abdomen:Minimal dark-red tinted emesis in basin and dried on his hands/around mouth. 11/20/21 retention. IR:CVELFYB for extremity edema. Skin:Skin is warm and dry. Neuro: Mental Status: Patient is awake, alert, oriented x3 No signs of aphasia or neglect Cranial Nerves: III,IV, VI: EOMI without ptosis or diploplia.  V: Facial sensation is symmetric to light touch and temperature. VII: Facial movement is  symmetric.  VIII: Hearing is intact to voice XI: Shoulder shrug is symmetric. Motor: Bilateral UE 4/5, bilateral LE 0/5 despite maximal effort.  Sensory: Sensation is diminished in bilateral UE, nonexistent in bilateral LE. Psych:Normal mood and affect.  Patient Lines/Drains/Airways Status     Active Line/Drains/Airways     Name Placement date Placement time Site Days   Peripheral IV 15-Dec-2021 18 G Right Antecubital 12/15/21  0739  Antecubital  1   Peripheral IV 12/15/2021 18 G 1.16" Left;Distal Forearm 15-Dec-2021  1629  Forearm  1   Incision (Closed) Dec 15, 2021 Neck 12-15-21  1746  -- 1             ASSESSMENT/PLAN:  Assessment: Principal Problem:   Spinal cord compression (HCC) Active Problems:   History of atrial fibrillation   Osteoarthritis   CHF (congestive heart failure) (HCC)   Hypertension   Plan: C5-C6 spinal cord compression Osteoarthritis POD 1 from emergent decompression at C5-6. Afebrile, hemodynamically stable. Leukocytosis 17.0 from 10.8, likely reactive from procedure. He reports significant improvement in his pain level, now just an ache. Bilateral LE function has yet to return and he has urinary retention. Bilateral UE strength is mildly improved today.  -Neurosurgery consulted, appreciate their assistance             -Follow recs -PT/OT consult when appropriate; hopeful for CIR -Trend CBC -Zofran, phenergan -Pain control  Normocytic anemia Hgb this morning of 12.3. He has had severe nausea with coffee-ground appearing emesis this morning, which I feel is related to  the events over the last day combined with intubation and shifting of neck structures during procedure yesterday and less likely from upper GI bleed, though an ulcer can not be ruled out. -Recheck CBC 1800 -If further decrease in Hgb or persistent hematemesis, consult GI  Urinary retention Patient had saddle anesthesia on admission and has not had return of lower extremity motor or sensory  function. In/out cath this morning had 1800 cc UOP. -Place foley catheter   Hypertension Normotensive. Managed outpatient with metoprolol. -Hold home metoprolol; resume if indicated   Hx atrial fibrillation Managed outpatient with Eliquis. -Resume Eliquis if Hgb stable -Tele   Hx HFrEF No signs of volume overload at this time. Currently holding home Lasix due to soft BP at time of admission, Entresto restarted overnight.  -Resume Lasix as indicated -Continue Entresto   Hx CVA Hx HLD -Continue home atorvastatin    Best Practice: Diet: Regular diet IVF: None VTE: SCD's Start: 11-24-2021 1945 SCDs Start: November 24, 2021 1234 Code: Full AB: None Therapy Recs: Pending Family Contact: Debra, called and notified. DISPO: Anticipated discharge pending Medical stability.  Signature: Champ Mungo, D.O.  Internal Medicine Resident, PGY-1 [facility]  Pager: [phone] 9:34 AM, 11/18/2021   Please contact the on call pager after 5 pm and on weekends at [phone].

## 2021-11-18 NOTE — Evaluation (Signed)
Physical Therapy Evaluation Patient Details Name: Stephen Whitney MRN: [id number] DOB: 07-09-1950 Today's Date: 11/18/2021  History of Present Illness  71 y/o male presented to ED on 11/05/2021 for complaints of neck pain and stiffness along with numbness in UEs and weakness in LEs. 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 S/p ACDF C5-6 on 6/17. PMH: CHF, COPD, Afib, HTN, CVA  Clinical Impression  Patient admitted with the above findings. PTA, patient was independent and used SPC as needed for mobility and lives with wife. Patient lethargic on evaluation but family present and supportive. Patient currently unable to activate musculature in bilateral LE, decreased sensation to R LE, and lack of sensation on L LE (does not respond to deep pressure on nail bed). Patient currently requires totalA+2 for rolling. Educated patient/family on cervical precautions and positioning techniques to reduce swelling and risk for pressure ulcers. Patient will benefit from skilled PT services during acute stay to address listed deficits. Recommend AIR at discharge to focus on overall mobility and family education.        Recommendations for follow up therapy are one component of a multi-disciplinary discharge planning process, led by the attending physician.  Recommendations may be updated based on patient status, additional functional criteria and insurance authorization.  Follow Up Recommendations Acute inpatient rehab (3hours/day)    Assistance Recommended at Discharge Frequent or constant Supervision/Assistance  Patient can return home with the following  Two people to help with walking and/or transfers;Two people to help with bathing/dressing/bathroom;Assistance with cooking/housework;Assistance with feeding;Direct supervision/assist for medications management;Direct supervision/assist for financial management;Assist for transportation;Help with stairs or ramp for entrance     Equipment Recommendations Wheelchair (measurements PT);Wheelchair cushion (measurements PT);Hospital bed (hoyer lift)  Recommendations for Other Services       Functional Status Assessment Patient has had a recent decline in their functional status and demonstrates the ability to make significant improvements in function in a reasonable and predictable amount of time.     Precautions / Restrictions Precautions Precautions: Fall;Cervical Precaution Booklet Issued: Yes (comment) Precaution Comments: verbally reviewed 3/3 cervical precuations with pt/family Required Braces or Orthoses: Other Brace Other Brace: no brace needed per MD Restrictions Weight Bearing Restrictions: No      Mobility  Bed Mobility Overal bed mobility: Needs Assistance Bed Mobility: Rolling Rolling: Total assist, +2 for physical assistance              Transfers                   General transfer comment: deferred    Ambulation/Gait                  Stairs            Wheelchair Mobility    Modified Rankin (Stroke Patients Only)       Balance Overall balance assessment: Needs assistance Sitting-balance support: Bilateral upper extremity supported, Feet supported Sitting balance-Leahy Scale: Poor Sitting balance - Comments: pt requiring posterior support, declining sitting EOB                                     Pertinent Vitals/Pain Pain Assessment Pain Assessment: Faces Faces Pain Scale: Hurts whole lot Pain Location: BLE's to touch Pain Descriptors / Indicators: Discomfort, Grimacing Pain Intervention(s): Monitored during session    Home Living Family/patient expects to be discharged to:: Private residence Living Arrangements:  Spouse/significant other Available Help at Discharge: Available PRN/intermittently (wife works part time) Type of Home: House Home Access: Stairs to enter   [employer] of Steps: 2-3 at front, 1 step at  [employer]: One level Home Equipment: Agricultural consultant (2 wheels);Rollator (4 wheels);Cane - single point      Prior Function Prior Level of Function : Independent/Modified Independent             Mobility Comments: cane PRN ADLs Comments: ind with ADLs     Hand Dominance        Extremity/Trunk Assessment   Upper Extremity Assessment Upper Extremity Assessment: RUE deficits/detail;LUE deficits/detail RUE Deficits / Details: ~40* shoulder flexion, 2-/5 strength, hx of dupuytren's contracutre, 4th & 5th digits contracted RUE Sensation: WNL (apparent WNL, will further assess) RUE Coordination: decreased fine motor;decreased gross motor LUE Deficits / Details: minimal AROM noted, dupuytren's contracutre with 4th & 5th digits contracted LUE Sensation: WNL (apparent WNL, able to identify which area of arm/hand is being touched) LUE Coordination: decreased fine motor;decreased gross motor    Lower Extremity Assessment Lower Extremity Assessment: RLE deficits/detail;LLE deficits/detail RLE Deficits / Details: 0/5 with decreased sensation to deep pressure on nail bed RLE Sensation: decreased light touch;decreased proprioception RLE Coordination: decreased fine motor;decreased gross motor LLE Deficits / Details: 0/5; lack of sensation to deep pressure on nail bed LLE Sensation: decreased light touch;decreased proprioception LLE Coordination: decreased fine motor;decreased gross motor    Cervical / Trunk Assessment Cervical / Trunk Assessment: Neck Surgery  Communication   Communication: Other (comment) (difficult to assess due to lethargy/pain, increased time to respond)  Cognition Arousal/Alertness: Lethargic Behavior During Therapy: Flat affect Overall Cognitive Status: Difficult to assess                                 General Comments: pt able to respond to questions asked appropriately when given increased time, will further assess         General Comments General comments (skin integrity, edema, etc.): VSS on 2L O2, family present and supportive throughout session. Discussed elevation and supportive positioning along with desensitization strategies    Exercises     Assessment/Plan    PT Assessment Patient needs continued PT services  PT Problem List Decreased strength;Decreased activity tolerance;Decreased balance;Decreased mobility;Decreased coordination;Decreased knowledge of use of DME;Decreased safety awareness;Decreased knowledge of precautions;Impaired sensation       PT Treatment Interventions DME instruction;Functional mobility training;Therapeutic activities;Therapeutic exercise;Balance training;Patient/family education;Wheelchair mobility training;Manual techniques;Neuromuscular re-education    PT Goals (Current goals can be found in the Care Plan section)  Acute Rehab PT Goals Patient Stated Goal: did not state PT Goal Formulation: With patient/family Time For Goal Achievement: 12/02/21 Potential to Achieve Goals: Fair    Frequency Min 4X/week     Co-evaluation PT/OT/SLP Co-Evaluation/Treatment: Yes Reason for Co-Treatment: Complexity of the patient's impairments (multi-system involvement);For patient/therapist safety;To address functional/ADL transfers PT goals addressed during session: Strengthening/ROM OT goals addressed during session: ADL's and self-care;Strengthening/ROM       AM-PAC PT "6 Clicks" Mobility  Outcome Measure Help needed turning from your back to your side while in a flat bed without using bedrails?: Total Help needed moving from lying on your back to sitting on the side of a flat bed without using bedrails?: Total Help needed moving to and from a bed to a chair (including a wheelchair)?: Total Help needed standing up from a chair using  your arms (e.g., wheelchair or bedside chair)?: Total Help needed to walk in hospital room?: Total Help needed climbing 3-5 steps with a  railing? : Total 6 Click Score: 6    End of Session Equipment Utilized During Treatment: Oxygen Activity Tolerance: Patient limited by lethargy;Patient limited by pain Patient left: in bed;with call bell/phone within reach;with family/visitor present Nurse Communication: Mobility status;Other (comment) (Need for prevalon boots and soft touch call bell) PT Visit Diagnosis: Other abnormalities of gait and mobility (R26.89);Muscle weakness (generalized) (M62.81);Other symptoms and signs involving the nervous system (I96.789)    Time: 3810-1751 PT Time Calculation (min) (ACUTE ONLY): 28 min   Charges:   PT Evaluation $PT Eval Moderate Complexity: 1 Mod          Ramyah Pankowski A. Dan Humphreys PT, DPT Acute Rehabilitation Services Office [phone]   Viviann Spare 11/18/2021, 4:20 PM

## 2021-11-18 NOTE — Evaluation (Signed)
Occupational Therapy Evaluation Patient Details Name: Stephen Whitney MRN: [id number] DOB: [birth year] Today's Date: 11/18/2021   History of Present Illness Pt is a 71 y/o M presenting with neck pain/stiffness and extremity weakness. 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 S/p ACDF C5-6 on 6/17. PMH includes A-fib, CHF, COPD, bilateral Dupuytren's contractures, GAD, Hep C, OA and HTN.   Clinical Impression   Pt independent at baseline with ADLs, used a SPC as needed for mobility. Pt currently lethargic, eyes closed frequently during session, family present in room and provides home set up/PLOF information. Pt currently max-total A +2 for ADLs, follows commands with increased time, and minimally verbalizes during session. Pt with BUE deficits, RUE with more strength/ROM compared to LUE. Pt sensation grossly WFL, identifies area of BUE being touched accurately. Pt with hypersensitivity, reporting pain when therapist touches knees, and when sheet is taken off pt's legs. Reviewed 3/3 cervical precautions with pt/family, will need reinforcement. Pt presenting with impairments listed below, will follow acutely. Recommend AIR at d/c.      Recommendations for follow up therapy are one component of a multi-disciplinary discharge planning process, led by the attending physician.  Recommendations may be updated based on patient status, additional functional criteria and insurance authorization.   Follow Up Recommendations  Acute inpatient rehab (3hours/day)    Assistance Recommended at Discharge Frequent or constant Supervision/Assistance  Patient can return home with the following Two people to help with walking and/or transfers;Two people to help with bathing/dressing/bathroom;Assistance with cooking/housework;Assistance with feeding;Direct supervision/assist for medications management;Direct supervision/assist for financial management;Assist for transportation;Help with stairs or ramp  for entrance    Functional Status Assessment  Patient has had a recent decline in their functional status and demonstrates the ability to make significant improvements in function in a reasonable and predictable amount of time.  Equipment Recommendations  None recommended by OT;Other (comment) (defer to next venue of care)    Recommendations for Other Services PT consult;Rehab consult     Precautions / Restrictions Precautions Precautions: Fall;Cervical Precaution Booklet Issued: Yes (comment) Precaution Comments: verbally reviewed 3/3 cervical precuations with pt/family Required Braces or Orthoses: Other Brace Other Brace: no brace needed per MD Restrictions Weight Bearing Restrictions: No      Mobility Bed Mobility Overal bed mobility: Needs Assistance Bed Mobility: Rolling Rolling: Total assist, +2 for physical assistance              Transfers                   General transfer comment: deferred      Balance Overall balance assessment: Needs assistance Sitting-balance support: Bilateral upper extremity supported, Feet supported Sitting balance-Leahy Scale: Poor Sitting balance - Comments: pt requiring posterior support, declining sitting EOB                                   ADL either performed or assessed with clinical judgement   ADL Overall ADL's : Needs assistance/impaired Eating/Feeding: Maximal assistance;Bed level   Grooming: Maximal assistance;Bed level   Upper Body Bathing: Bed level;Total assistance   Lower Body Bathing: Bed level;Total assistance   Upper Body Dressing : Maximal assistance;Bed level Upper Body Dressing Details (indicate cue type and reason): to don gown Lower Body Dressing: Maximal assistance;Bed level Lower Body Dressing Details (indicate cue type and reason): to don socks Toilet Transfer: Total assistance;+2 for physical assistance;+2 for safety/equipment;Stand-pivot;BSC/3in1  Toileting- Clothing  Manipulation and Hygiene: Maximal assistance;Bed level       Functional mobility during ADLs: Maximal assistance;Total assistance;+2 for physical assistance;+2 for safety/equipment       Vision   Additional Comments: will further assess, pt with eyes closed majority of session     Perception     Praxis      Pertinent Vitals/Pain Pain Assessment Pain Assessment: Faces Pain Score: 7  Faces Pain Scale: Hurts whole lot Pain Location: BLE's to touch Pain Descriptors / Indicators: Discomfort, Grimacing Pain Intervention(s): Limited activity within patient's tolerance, Monitored during session     Hand Dominance     Extremity/Trunk Assessment Upper Extremity Assessment Upper Extremity Assessment: RUE deficits/detail;LUE deficits/detail RUE Deficits / Details: ~40* shoulder flexion, 2-/5 strength, hx of dupuytren's contracutre, 4th & 5th digits contracted RUE Sensation: WNL (apparent WNL, will further assess) RUE Coordination: decreased fine motor;decreased gross motor LUE Deficits / Details: minimal AROM noted, dupuytren's contracutre with 4th & 5th digits contracted LUE Sensation: WNL (apparent WNL, able to identify which area of arm/hand is being touched) LUE Coordination: decreased fine motor;decreased gross motor   Lower Extremity Assessment Lower Extremity Assessment: Defer to PT evaluation   Cervical / Trunk Assessment Cervical / Trunk Assessment: Neck Surgery   Communication Communication Communication: Other (comment) (difficult to assess due to lethargy/pain, increased time to respond)   Cognition Arousal/Alertness: Lethargic Behavior During Therapy: Flat affect Overall Cognitive Status: Difficult to assess                                 General Comments: pt able to respond to questions asked appropriately when given increased time, will further assess     General Comments  VSS on RA, family present and supportive throughout session. Discussed  elevation and supportive positioning along with desensitization strategies    Exercises     Shoulder Instructions      Home Living Family/patient expects to be discharged to:: Private residence Living Arrangements: Spouse/significant other Available Help at Discharge: Available PRN/intermittently (wife works part time) Type of Home: House Home Access: Stairs to enter [employer] of Steps: 2-3 at front, 1 step at [employer]: One level     Bathroom Shower/Tub: Tub/shower unit;Curtain         Home Equipment: Agricultural consultant (2 wheels);Rollator (4 wheels);Cane - single point          Prior Functioning/Environment Prior Level of Function : Independent/Modified Independent             Mobility Comments: cane PRN ADLs Comments: ind with ADLs        OT Problem List: Decreased strength;Decreased range of motion;Decreased activity tolerance;Impaired balance (sitting and/or standing);Decreased cognition;Decreased safety awareness;Decreased coordination;Decreased knowledge of use of DME or AE;Decreased knowledge of precautions;Impaired UE functional use      OT Treatment/Interventions: Self-care/ADL training;Therapeutic exercise;DME and/or AE instruction;Therapeutic activities;Patient/family education;Balance training;Splinting;Energy conservation;Neuromuscular education    OT Goals(Current goals can be found in the care plan section) Acute Rehab OT Goals Patient Stated Goal: none stated OT Goal Formulation: With patient/family Time For Goal Achievement: 12/02/21 Potential to Achieve Goals: Fair ADL Goals Pt Will Perform Grooming: with min assist;bed level Pt Will Perform Upper Body Dressing: with mod assist;bed level Additional ADL Goal #1: Pt will complete bed mobility with mod A +2 in prep for ADLs  OT Frequency: Min 2X/week    Co-evaluation PT/OT/SLP Co-Evaluation/Treatment: Yes Reason for  Co-Treatment: Complexity of the patient's impairments  (multi-system involvement);For patient/therapist safety;To address functional/ADL transfers   OT goals addressed during session: ADL's and self-care;Strengthening/ROM      AM-PAC OT "6 Clicks" Daily Activity     Outcome Measure Help from another person eating meals?: A Lot Help from another person taking care of personal grooming?: A Lot Help from another person toileting, which includes using toliet, bedpan, or urinal?: Total Help from another person bathing (including washing, rinsing, drying)?: Total Help from another person to put on and taking off regular upper body clothing?: Total Help from another person to put on and taking off regular lower body clothing?: Total 6 Click Score: 8   End of Session Nurse Communication: Mobility status  Activity Tolerance: Patient limited by lethargy Patient left: in bed;with call bell/phone within reach;with bed alarm set;with family/visitor present;with nursing/sitter in room;with SCD's reapplied  OT Visit Diagnosis: Unsteadiness on feet (R26.81);Other abnormalities of gait and mobility (R26.89);Muscle weakness (generalized) (M62.81);Other symptoms and signs involving the nervous system (O75.643)                Time: 3295-1884 OT Time Calculation (min): 28 min Charges:  OT General Charges $OT Visit: 1 Visit OT Evaluation $OT Eval Moderate Complexity: 1 [address] OTD, OTR/L Acute Rehab [phone]   Mayer Masker 11/18/2021, 3:50 PM

## 2021-11-19 ENCOUNTER — Encounter (HOSPITAL_COMMUNITY): Admission: EM | Disposition: E | Payer: Self-pay | Source: Home / Self Care | Attending: Emergency Medicine

## 2021-11-19 ENCOUNTER — Other Ambulatory Visit: Payer: Self-pay

## 2021-11-19 ENCOUNTER — Inpatient Hospital Stay (HOSPITAL_COMMUNITY): Payer: Medicare HMO | Admitting: Anesthesiology

## 2021-11-19 ENCOUNTER — Encounter (HOSPITAL_COMMUNITY): Payer: Self-pay | Admitting: Neurological Surgery

## 2021-11-19 ENCOUNTER — Inpatient Hospital Stay (HOSPITAL_COMMUNITY): Payer: Medicare HMO

## 2021-11-19 DIAGNOSIS — I5022 Chronic systolic (congestive) heart failure: Secondary | ICD-10-CM

## 2021-11-19 DIAGNOSIS — J9601 Acute respiratory failure with hypoxia: Secondary | ICD-10-CM

## 2021-11-19 DIAGNOSIS — K5720 Diverticulitis of large intestine with perforation and abscess without bleeding: Secondary | ICD-10-CM | POA: Diagnosis not present

## 2021-11-19 DIAGNOSIS — T17908A Unspecified foreign body in respiratory tract, part unspecified causing other injury, initial encounter: Secondary | ICD-10-CM

## 2021-11-19 DIAGNOSIS — I110 Hypertensive heart disease with heart failure: Secondary | ICD-10-CM | POA: Diagnosis not present

## 2021-11-19 DIAGNOSIS — A419 Sepsis, unspecified organism: Secondary | ICD-10-CM

## 2021-11-19 DIAGNOSIS — R6521 Severe sepsis with septic shock: Secondary | ICD-10-CM

## 2021-11-19 DIAGNOSIS — F17210 Nicotine dependence, cigarettes, uncomplicated: Secondary | ICD-10-CM

## 2021-11-19 DIAGNOSIS — K668 Other specified disorders of peritoneum: Secondary | ICD-10-CM

## 2021-11-19 DIAGNOSIS — K631 Perforation of intestine (nontraumatic): Secondary | ICD-10-CM

## 2021-11-19 DIAGNOSIS — G9520 Unspecified cord compression: Secondary | ICD-10-CM | POA: Diagnosis not present

## 2021-11-19 DIAGNOSIS — J449 Chronic obstructive pulmonary disease, unspecified: Secondary | ICD-10-CM | POA: Diagnosis not present

## 2021-11-19 DIAGNOSIS — J9602 Acute respiratory failure with hypercapnia: Secondary | ICD-10-CM | POA: Diagnosis not present

## 2021-11-19 DIAGNOSIS — I252 Old myocardial infarction: Secondary | ICD-10-CM

## 2021-11-19 DIAGNOSIS — I509 Heart failure, unspecified: Secondary | ICD-10-CM | POA: Diagnosis not present

## 2021-11-19 HISTORY — PX: LAPAROTOMY: SHX154

## 2021-11-19 LAB — POCT I-STAT 7, (LYTES, BLD GAS, ICA,H+H)
Acid-base deficit: 2 mmol/L (ref 0.0–2.0)
Acid-base deficit: 5 mmol/L — ABNORMAL HIGH (ref 0.0–2.0)
Acid-base deficit: 1 mmol/L (ref 0.0–2.0)
Bicarbonate: 25.4 mmol/L (ref 20.0–28.0)
Bicarbonate: 24.5 mmol/L (ref 20.0–28.0)
Bicarbonate: 25.5 mmol/L (ref 20.0–28.0)
Calcium, Ion: 1.25 mmol/L (ref 1.15–1.40)
Calcium, Ion: 1.24 mmol/L (ref 1.15–1.40)
Calcium, Ion: 1.2 mmol/L (ref 1.15–1.40)
HCT: 29 % — ABNORMAL LOW (ref 39.0–52.0)
HCT: 32 % — ABNORMAL LOW (ref 39.0–52.0)
HCT: 27 % — ABNORMAL LOW (ref 39.0–52.0)
Hemoglobin: 9.9 g/dL — ABNORMAL LOW (ref 13.0–17.0)
Hemoglobin: 10.9 g/dL — ABNORMAL LOW (ref 13.0–17.0)
Hemoglobin: 9.2 g/dL — ABNORMAL LOW (ref 13.0–17.0)
O2 Saturation: 89 %
O2 Saturation: 84 %
O2 Saturation: 94 %
Patient temperature: 37.6
Patient temperature: 37.2
Patient temperature: 98.4
Potassium: 5 mmol/L (ref 3.5–5.1)
Potassium: 4.9 mmol/L (ref 3.5–5.1)
Potassium: 4.9 mmol/L (ref 3.5–5.1)
Sodium: 135 mmol/L (ref 135–145)
Sodium: 136 mmol/L (ref 135–145)
Sodium: 136 mmol/L (ref 135–145)
TCO2: 27 mmol/L (ref 22–32)
TCO2: 27 mmol/L (ref 22–32)
TCO2: 27 mmol/L (ref 22–32)
pCO2 arterial: 57 mmHg — ABNORMAL HIGH (ref 32–48)
pCO2 arterial: 66 mmHg (ref 32–48)
pCO2 arterial: 52.5 mmHg — ABNORMAL HIGH (ref 32–48)
pH, Arterial: 7.26 — ABNORMAL LOW (ref 7.35–7.45)
pH, Arterial: 7.18 — CL (ref 7.35–7.45)
pH, Arterial: 7.294 — ABNORMAL LOW (ref 7.35–7.45)
pO2, Arterial: 68 mmHg — ABNORMAL LOW (ref 83–108)
pO2, Arterial: 63 mmHg — ABNORMAL LOW (ref 83–108)
pO2, Arterial: 80 mmHg — ABNORMAL LOW (ref 83–108)

## 2021-11-19 LAB — BLOOD GAS, VENOUS
Acid-base deficit: 0.2 mmol/L (ref 0.0–2.0)
Bicarbonate: 24 mmol/L (ref 20.0–28.0)
Drawn by: 19824
O2 Saturation: 75.9 %
Patient temperature: 36.4
pCO2, Ven: 36 mmHg — ABNORMAL LOW (ref 44–60)
pH, Ven: 7.43 (ref 7.25–7.43)
pO2, Ven: 42 mmHg (ref 32–45)

## 2021-11-19 LAB — COMPREHENSIVE METABOLIC PANEL
ALT: 15 U/L (ref 0–44)
AST: 25 U/L (ref 15–41)
Albumin: 2.8 g/dL — ABNORMAL LOW (ref 3.5–5.0)
Alkaline Phosphatase: 26 U/L — ABNORMAL LOW (ref 38–126)
Anion gap: 8 (ref 5–15)
BUN: 33 mg/dL — ABNORMAL HIGH (ref 8–23)
CO2: 21 mmol/L — ABNORMAL LOW (ref 22–32)
Calcium: 8.5 mg/dL — ABNORMAL LOW (ref 8.9–10.3)
Chloride: 104 mmol/L (ref 98–111)
Creatinine, Ser: 1.29 mg/dL — ABNORMAL HIGH (ref 0.61–1.24)
GFR, Estimated: >60 mL/min (ref >60)
Glucose, Bld: 130 mg/dL — ABNORMAL HIGH (ref 70–99)
Potassium: 4.4 mmol/L (ref 3.5–5.1)
Sodium: 133 mmol/L — ABNORMAL LOW (ref 135–145)
Total Bilirubin: 1.1 mg/dL (ref 0.3–1.2)
Total Protein: 4.9 g/dL — ABNORMAL LOW (ref 6.5–8.1)

## 2021-11-19 LAB — TYPE AND SCREEN
ABO/RH(D): A POS
Antibody Screen: NEGATIVE

## 2021-11-19 LAB — CBC
HCT: 31.2 % — ABNORMAL LOW (ref 39.0–52.0)
Hemoglobin: 10.8 g/dL — ABNORMAL LOW (ref 13.0–17.0)
MCH: 32.3 pg (ref 26.0–34.0)
MCHC: 34.6 g/dL (ref 30.0–36.0)
MCV: 93.4 fL (ref 80.0–100.0)
Platelets: 116 10*3/uL — ABNORMAL LOW (ref 150–400)
RBC: 3.34 MIL/uL — ABNORMAL LOW (ref 4.22–5.81)
RDW: 12.1 % (ref 11.5–15.5)
WBC: 14.4 10*3/uL — ABNORMAL HIGH (ref 4.0–10.5)
nRBC: 0 % (ref 0.0–0.2)

## 2021-11-19 LAB — APTT: aPTT: 29 seconds (ref 24–36)

## 2021-11-19 LAB — PROTIME-INR
INR: 1.2 (ref 0.8–1.2)
Prothrombin Time: 15.3 seconds — ABNORMAL HIGH (ref 11.4–15.2)

## 2021-11-19 LAB — HEPARIN LEVEL (UNFRACTIONATED): Heparin Unfractionated: >1.1 IU/mL — ABNORMAL HIGH (ref 0.30–0.70)

## 2021-11-19 LAB — GLUCOSE, CAPILLARY: Glucose-Capillary: 157 mg/dL — ABNORMAL HIGH (ref 70–99)

## 2021-11-19 LAB — ABO/RH: ABO/RH(D): A POS

## 2021-11-19 LAB — LACTIC ACID, PLASMA: Lactic Acid, Venous: 2.3 mmol/L (ref 0.5–1.9)

## 2021-11-19 SURGERY — LAPAROTOMY, EXPLORATORY
Anesthesia: General

## 2021-11-19 MED ORDER — CHLORHEXIDINE GLUCONATE 0.12 % MT SOLN
OROMUCOSAL | Status: AC
  Filled 2021-11-19: qty 15

## 2021-11-19 MED ORDER — FENTANYL CITRATE (PF) 250 MCG/5ML IJ SOLN
INTRAMUSCULAR | Status: AC
  Filled 2021-11-19: qty 5

## 2021-11-19 MED ORDER — ORAL CARE MOUTH RINSE: 15.0000 mL | OROMUCOSAL | Status: DC | PRN

## 2021-11-19 MED ORDER — 0.9 % SODIUM CHLORIDE (POUR BTL) OPTIME
TOPICAL | Status: DC | PRN
  Administered 2021-11-19 (×4): 1000 mL

## 2021-11-19 MED ORDER — IPRATROPIUM-ALBUTEROL 0.5-2.5 (3) MG/3ML IN SOLN: 3.0000 mL | Freq: Three times a day (TID) | RESPIRATORY_TRACT | Status: DC

## 2021-11-19 MED ORDER — PANTOPRAZOLE SODIUM 40 MG IV SOLR
40.0000 mg | INTRAVENOUS | Status: DC
  Administered 2021-11-19 – 2021-11-21 (×3): 40 mg via INTRAVENOUS
  Filled 2021-11-19 (×4): qty 10

## 2021-11-19 MED ORDER — SODIUM CHLORIDE 0.9 % IV SOLN: INTRAVENOUS | Status: DC | PRN

## 2021-11-19 MED ORDER — ONDANSETRON HCL 4 MG/2ML IJ SOLN
INTRAMUSCULAR | Status: DC | PRN
  Administered 2021-11-19: 4 mg via INTRAVENOUS

## 2021-11-19 MED ORDER — METHYLPREDNISOLONE SODIUM SUCC 40 MG IJ SOLR
40.0000 mg | INTRAMUSCULAR | Status: DC
  Administered 2021-11-19 – 2021-11-20 (×2): 40 mg via INTRAVENOUS
  Filled 2021-11-19 (×2): qty 1

## 2021-11-19 MED ORDER — SODIUM CHLORIDE 0.9 % IV SOLN
1.5000 g | Freq: Four times a day (QID) | INTRAVENOUS | Status: DC
  Administered 2021-11-19: 1.5 g via INTRAVENOUS
  Filled 2021-11-19 (×4): qty 4

## 2021-11-19 MED ORDER — PROPOFOL 10 MG/ML IV BOLUS
INTRAVENOUS | Status: DC | PRN
  Administered 2021-11-19: 80 mg via INTRAVENOUS

## 2021-11-19 MED ORDER — PROPOFOL 500 MG/50ML IV EMUL
INTRAVENOUS | Status: DC | PRN
  Administered 2021-11-19: 25 ug/kg/min via INTRAVENOUS

## 2021-11-19 MED ORDER — SODIUM BICARBONATE 8.4 % IV SOLN
INTRAVENOUS | Status: DC | PRN
  Administered 2021-11-19: 50 meq via INTRAVENOUS

## 2021-11-19 MED ORDER — IOHEXOL 9 MG/ML PO SOLN
ORAL | Status: AC
  Administered 2021-11-19: 500 mL
  Filled 2021-11-19: qty 1000

## 2021-11-19 MED ORDER — PROPOFOL 1000 MG/100ML IV EMUL
5.0000 ug/kg/min | INTRAVENOUS | Status: DC
  Administered 2021-11-20 (×2): 30 ug/kg/min via INTRAVENOUS
  Filled 2021-11-19 (×2): qty 100

## 2021-11-19 MED ORDER — DEXTROSE-NACL 5-0.9 % IV SOLN: INTRAVENOUS | Status: DC

## 2021-11-19 MED ORDER — ROCURONIUM 10MG/ML (10ML) SYRINGE FOR MEDFUSION PUMP - OPTIME
INTRAVENOUS | Status: DC | PRN
  Administered 2021-11-19: 50 mg via INTRAVENOUS
  Administered 2021-11-19: 20 mg via INTRAVENOUS

## 2021-11-19 MED ORDER — PROTHROMBIN COMPLEX CONC HUMAN 500 UNITS IV KIT
4228.0000 [IU] | PACK | Status: AC
  Administered 2021-11-19: 4228 [IU] via INTRAVENOUS
  Filled 2021-11-19: qty 4228

## 2021-11-19 MED ORDER — PANTOPRAZOLE 2 MG/ML SUSPENSION: 40.0000 mg | Freq: Every day | ORAL | Status: DC

## 2021-11-19 MED ORDER — CHLORHEXIDINE GLUCONATE 0.12 % MT SOLN: 15.0000 mL | Freq: Once | OROMUCOSAL | Status: DC

## 2021-11-19 MED ORDER — CHLORHEXIDINE GLUCONATE CLOTH 2 % EX PADS
6.0000 | MEDICATED_PAD | Freq: Every day | CUTANEOUS | Status: DC
Start: 2021-11-19 — End: 2021-11-27
  Administered 2021-11-19 – 2021-11-25 (×9): 6 via TOPICAL

## 2021-11-19 MED ORDER — LACTATED RINGERS IV SOLN: INTRAVENOUS | Status: DC

## 2021-11-19 MED ORDER — NOREPINEPHRINE 4 MG/250ML-% IV SOLN
2.0000 ug/min | INTRAVENOUS | Status: DC
  Administered 2021-11-19: 2 ug/min via INTRAVENOUS
  Filled 2021-11-19: qty 250

## 2021-11-19 MED ORDER — ORAL CARE MOUTH RINSE: 15.0000 mL | Freq: Once | OROMUCOSAL | Status: DC

## 2021-11-19 MED ORDER — PIPERACILLIN-TAZOBACTAM 3.375 G IVPB
3.3750 g | Freq: Three times a day (TID) | INTRAVENOUS | Status: DC
  Administered 2021-11-19 – 2021-11-20 (×3): 3.375 g via INTRAVENOUS
  Filled 2021-11-19 (×3): qty 50

## 2021-11-19 MED ORDER — FENTANYL CITRATE (PF) 100 MCG/2ML IJ SOLN: 25.0000 ug | INTRAMUSCULAR | Status: DC | PRN

## 2021-11-19 MED ORDER — DIPHENHYDRAMINE HCL 50 MG/ML IJ SOLN
50.0000 mg | Freq: Once | INTRAMUSCULAR | Status: AC
  Administered 2021-11-19: 50 mg via INTRAVENOUS
  Filled 2021-11-19: qty 1

## 2021-11-19 MED ORDER — FENTANYL CITRATE (PF) 100 MCG/2ML IJ SOLN
25.0000 ug | INTRAMUSCULAR | Status: DC | PRN
  Administered 2021-11-19 – 2021-11-20 (×2): 100 ug via INTRAVENOUS
  Administered 2021-11-20: 50 ug via INTRAVENOUS
  Filled 2021-11-19 (×3): qty 2

## 2021-11-19 MED ORDER — LACTATED RINGERS IV SOLN: INTRAVENOUS | Status: DC | PRN

## 2021-11-19 MED ORDER — LIDOCAINE HCL (CARDIAC) PF 100 MG/5ML IV SOSY
PREFILLED_SYRINGE | INTRAVENOUS | Status: DC | PRN
  Administered 2021-11-19: 60 mg via INTRAVENOUS

## 2021-11-19 MED ORDER — DEXAMETHASONE SODIUM PHOSPHATE 10 MG/ML IJ SOLN
INTRAMUSCULAR | Status: DC | PRN
  Administered 2021-11-19: 5 mg via INTRAVENOUS

## 2021-11-19 MED ORDER — PHENYLEPHRINE HCL (PRESSORS) 10 MG/ML IV SOLN
INTRAVENOUS | Status: DC | PRN
  Administered 2021-11-19 (×2): 80 ug via INTRAVENOUS

## 2021-11-19 MED ORDER — PHENYLEPHRINE HCL-NACL 20-0.9 MG/250ML-% IV SOLN
INTRAVENOUS | Status: DC | PRN
  Administered 2021-11-19: 50 ug/min via INTRAVENOUS

## 2021-11-19 MED ORDER — ORAL CARE MOUTH RINSE
15.0000 mL | OROMUCOSAL | Status: DC
  Administered 2021-11-20 (×8): 15 mL via OROMUCOSAL

## 2021-11-19 MED ORDER — IPRATROPIUM-ALBUTEROL 0.5-2.5 (3) MG/3ML IN SOLN
3.0000 mL | RESPIRATORY_TRACT | Status: DC
Start: 2021-11-19 — End: 2021-11-19
  Administered 2021-11-19 (×2): 3 mL via RESPIRATORY_TRACT
  Filled 2021-11-19: qty 3

## 2021-11-19 MED ORDER — ALBUMIN HUMAN 5 % IV SOLN: INTRAVENOUS | Status: DC | PRN

## 2021-11-19 MED ORDER — MIDAZOLAM HCL 2 MG/2ML IJ SOLN
INTRAMUSCULAR | Status: AC
  Filled 2021-11-19: qty 2

## 2021-11-19 MED ORDER — IPRATROPIUM-ALBUTEROL 0.5-2.5 (3) MG/3ML IN SOLN
RESPIRATORY_TRACT | Status: AC
  Filled 2021-11-19: qty 3

## 2021-11-19 MED ORDER — PROPOFOL 10 MG/ML IV BOLUS
INTRAVENOUS | Status: AC
  Filled 2021-11-19: qty 20

## 2021-11-19 MED ORDER — FENTANYL CITRATE (PF) 250 MCG/5ML IJ SOLN
INTRAMUSCULAR | Status: DC | PRN
  Administered 2021-11-19 (×2): 50 ug via INTRAVENOUS

## 2021-11-19 MED ORDER — IOHEXOL 300 MG/ML  SOLN
100.0000 mL | Freq: Once | INTRAMUSCULAR | Status: AC | PRN
  Administered 2021-11-19: 100 mL via INTRAVENOUS

## 2021-11-19 SURGICAL SUPPLY — 52 items
BLADE CLIPPER SURG (BLADE) ×1 IMPLANT
CANISTER SUCT 3000ML PPV (MISCELLANEOUS) ×3 IMPLANT
CHLORAPREP W/TINT 26 (MISCELLANEOUS) ×2 IMPLANT
COVER SURGICAL LIGHT HANDLE (MISCELLANEOUS) ×2 IMPLANT
DERMABOND ADVANCED (GAUZE/BANDAGES/DRESSINGS)
DERMABOND ADVANCED .7 DNX12 (GAUZE/BANDAGES/DRESSINGS) ×2 IMPLANT
DRAPE INCISE IOBAN 66X45 STRL (DRAPES) ×2 IMPLANT
DRAPE LAPAROSCOPIC ABDOMINAL (DRAPES) ×2 IMPLANT
DRAPE WARM FLUID 44X44 (DRAPES) ×2 IMPLANT
DRSG OPSITE POSTOP 4X10 (GAUZE/BANDAGES/DRESSINGS) IMPLANT
DRSG OPSITE POSTOP 4X8 (GAUZE/BANDAGES/DRESSINGS) IMPLANT
ELECT REM PT RETURN 9FT ADLT (ELECTROSURGICAL) ×2
ELECTRODE REM PT RTRN 9FT ADLT (ELECTROSURGICAL) ×1 IMPLANT
GAUZE SPONGE 4X4 12PLY STRL (GAUZE/BANDAGES/DRESSINGS) ×1 IMPLANT
GLOVE BIOGEL PI IND STRL 6 (GLOVE) ×1 IMPLANT
GLOVE BIOGEL PI INDICATOR 6 (GLOVE) ×1
GLOVE BIOGEL PI MICRO 5.5 (GLOVE) ×1
GLOVE BIOGEL PI MICRO STRL 5.5 (GLOVE) ×1 IMPLANT
GOWN STRL REUS W/ TWL LRG LVL3 (GOWN DISPOSABLE) ×2 IMPLANT
GOWN STRL REUS W/TWL LRG LVL3 (GOWN DISPOSABLE) ×2
HANDLE SUCTION POOLE (INSTRUMENTS) ×1 IMPLANT
KIT BASIN OR (CUSTOM PROCEDURE TRAY) ×2 IMPLANT
KIT OSTOMY DRAINABLE 2.75 STR (WOUND CARE) ×1 IMPLANT
KIT TURNOVER KIT B (KITS) ×2 IMPLANT
LIGASURE IMPACT 36 18CM CVD LR (INSTRUMENTS) ×1 IMPLANT
NS IRRIG 1000ML POUR BTL (IV SOLUTION) ×6 IMPLANT
PACK GENERAL/GYN (CUSTOM PROCEDURE TRAY) ×2 IMPLANT
PAD ARMBOARD 7.5X6 YLW CONV (MISCELLANEOUS) ×2 IMPLANT
PENCIL SMOKE EVACUATOR (MISCELLANEOUS) ×2 IMPLANT
RELOAD PROXIMATE 75MM BLUE (ENDOMECHANICALS) ×4 IMPLANT
RELOAD STAPLE 75 3.8 BLU REG (ENDOMECHANICALS) IMPLANT
RETRACTOR WOUND ALXS 34CM XLRG (MISCELLANEOUS) IMPLANT
RTRCTR WOUND ALEXIS 34CM XLRG (MISCELLANEOUS) ×2
SLEEVE SUCTION CATH 165 (SLEEVE) ×2 IMPLANT
SPECIMEN JAR LARGE (MISCELLANEOUS) ×1 IMPLANT
SPONGE T-LAP 18X18 ~~LOC~~+RFID (SPONGE) ×3 IMPLANT
STAPLER GUN LINEAR PROX 60 (STAPLE) ×1 IMPLANT
STAPLER PROXIMATE 75MM BLUE (STAPLE) ×1 IMPLANT
STAPLER VISISTAT 35W (STAPLE) ×1 IMPLANT
SUCTION POOLE HANDLE (INSTRUMENTS) ×2
SUT MNCRL AB 4-0 PS2 18 (SUTURE) ×3 IMPLANT
SUT PDS AB 1 TP1 96 (SUTURE) ×3 IMPLANT
SUT PROLENE 0 CT 1 30 (SUTURE) ×1 IMPLANT
SUT SILK 2 0 SH CR/8 (SUTURE) ×2 IMPLANT
SUT SILK 2 0 TIES 10X30 (SUTURE) ×2 IMPLANT
SUT SILK 3 0 SH CR/8 (SUTURE) ×2 IMPLANT
SUT SILK 3 0 TIES 10X30 (SUTURE) ×2 IMPLANT
SUT VIC AB 3-0 SH 27 (SUTURE) ×2
SUT VIC AB 3-0 SH 27XBRD (SUTURE) ×2 IMPLANT
SUT VIC AB 3-0 SH 8-18 (SUTURE) ×2 IMPLANT
TAPE CLOTH SOFT 2X10 (GAUZE/BANDAGES/DRESSINGS) ×1 IMPLANT
TOWEL GREEN STERILE (TOWEL DISPOSABLE) ×2 IMPLANT

## 2021-11-19 NOTE — Op Note (Signed)
Date: 11/22/2021  Patient: Stephen Whitney MRN: [id number]  Preoperative Diagnosis: Pneumoperitoneum, possible small bowel perforation Postoperative Diagnosis: Perforated sigmoid diverticulitis  Procedure: Exploratory laparotomy, sigmoid resection, end sigmoid colostomy, primary repair of small bowel serosal injury  Surgeon: Sophronia Simas, MD  EBL: Minimal  Anesthesia: General endotracheal  Specimens: Sigmoid colon, stitch marks distal margin  Indications: Mr. Lennox is a 71 yo male who presented two days ago with leg weakness and neck pain and was found to have cervical spinal cord compression. He was taken to the OR emergently for decompression. Overnight last night he developed acute hypoxic respiratory failure, and a CXR showed possible pneumoperitoneum. Follow up CT scan confirmed moderate volume pneumoperitoneum with a possible small bowel diverticulum. After obtaining informed consent from the family, the patient was brought to the OR emergently for abdominal exploration.  Findings: Diverticular perforation of the sigmoid colon with an adherent loop of small bowel. No evidence of full thickness injury to the small bowel.  Procedure details: Informed consent was obtained in the preoperative area prior to the procedure. The patient was brought to the operating room and placed on the table in the supine position. General anesthesia was induced and appropriate lines and drains were placed for intraoperative monitoring. Perioperative antibiotics were administered per SCIP guidelines. The abdomen was prepped and draped in the usual sterile fashion. A pre-procedure timeout was taken verifying patient identity, surgical site and procedure to be performed.  A midline skin incision was made and the subcutaneous tissue was divided with cautery. The fascia was opened along the linea alba, and the peritoneum was elevated and opened. There was air on entry into the abdomen but no free fluid. An Allexis  wound protector and Bookwalter fixed retractor were placed. The stomach was mildly distended but otherwise grossly normal in appearance. The small bowel was run from the ligament of Treitz to the terminal ileum. There was a loop of ileum adherent to the sigmoid colon, which appeared to be the site of perforation. The small bowel loop was released using gentle finger dissection. The adherent loop of small bowel had a serosal injury but on close inspection there was no full thickness injury, and the surrounding tissue was pink and well-perfused. The sigmoid colon was examined. There was diverticulosis, and a small perforation was identified on the distal sigmoid colon with leakage of stool.  There was a focal area of thickening and inflammation of the surrounding colon, but the remainder of the sigmoid was healthy and soft. The decision was made to proceed with a sigmoid resection and end colostomy as the patient has been on steroids and is at high risk for anastomotic leak.  The distal descending colon and sigmoid colon were mobilized along the line of Toldt in a lateral to medial fashion.  A mesenteric window was created on the distal sigmoid colon, just proximal to the rectosigmoid junction, and the colon was divided at this point with a 75 mm GIA stapler with a blue load.  The mesentery was then divided proximal to this point using LigaSure.  The proximal sigmoid colon was then divided with a 75 mm GIA stapler with a blue load.  The distal margin was marked and the specimen was sent for routine pathology.  Next the abdomen was irrigated and appeared hemostatic.  The rectal stump was marked with a 0 Prolene suture. The small bowel was closely examined and the area that had been adherent to the sigmoid colon was inspected.  There was a  serosal tear along the mesenteric border, but there were no signs of full-thickness injury and the tissue overall appeared healthy.  The serosal tear was oversewn in 2 layers using  interrupted 3-0 Vicryl sutures and the inner layer and 3-0 silk Lembert sutures on the outer layer.    The retractors and wound protector were removed.  A circular skin incision was made in the right lower quadrant abdominal wall overlying the rectus muscle.  The subcutaneous tissue was cored out using cautery to expose the fascia.  A cruciate incision was made in the anterior rectus sheath, the rectus muscles were spread, and the peritoneum was opened.  The distal end of the sigmoid colon was passed through the abdominal wall and kept in proper orientation, taking care not to twist the mesentery.  The fascia was closed at the midline using a running looped 1 PDS suture.  The skin was very loosely reapproximated with staples to promote ongoing drainage from the wound.  A clean gauze dressing was applied.  The ostomy was matured using 3-0 Vicryl interrupted sutures, and an ostomy appliance was applied to the stoma.  Upon entering the abdomen (organ space), I encountered a phlegmon involving the sigmoid colon and small bowel .  CASE DATA:  Type of patient?: DOW CASE (Surgical Hospitalist [facility] Inpatient)  Status of Case? EMERGENT Add On  Infection Present At Time Of Surgery (PATOS)?  INFECTION of the sigmoid colon with perforation   All counts were correct x2 at the end of the procedure. The patient remained intubated and was transferred to the ICU for further care.  Sophronia Simas, MD 11/16/2021 8:17 PM

## 2021-11-19 NOTE — Progress Notes (Signed)
Inpatient Rehab Admissions Coordinator:    I met with pt. And family friend and spoke with wife and daughter via phone to discuss potential CIR admit. They are currently unsure they will be able to manage patient at home after CIR and would like to take a day to discuss and make a decision. I will follow up with them tomorrow.   Clemens Catholic, [place], [place] Admissions Coordinator  [phone] ([place]) [phone] (office)

## 2021-11-19 NOTE — Progress Notes (Signed)
Patient examined in preop area. Lethargic but arousable. Abdomen is soft but distended. Proceed to OR for exploratory laparotomy.  Sophronia Simas, MD [facility] Surgery General, Hepatobiliary and Pancreatic Surgery 11/03/2021 6:09 PM

## 2021-11-19 NOTE — Consult Note (Addendum)
NAME:  Stephen Whitney, MRN:  [id number], DOB:  Dec 08, 1950, LOS: 2 ADMISSION DATE:  11/10/2021, CONSULTATION DATE:  11/28/2021 REFERRING MD: Zenia Resides , CHIEF COMPLAINT:  Perforated sigmoid diverticulitis   History of Present Illness:  Stephen Whitney is a 71 y.o. M with PMH significant for Atrial fibrillation, CAD and MI, CHF, COPD, CVA who presented 6/17 with numbness, tingling and weakness in his arms and leg and pain of the back and neck.  He was diagnosed with C5-C6 compression and required emergent decompression.  On 6/19, pt developed acute respiratory distress requiring non-rebreather.  CXR was concerning for free air and CT abd/pelvis concerning for bowel perforation.  Surgery was consulted and pt taken to the OR for emergent ex-lap which revealed perforated sigmoid diverticulitis.  He was left intubated post-op and transferred to the ICU.  Pertinent  Medical History   has a past medical history of A-fib ([place]), CHF (congestive heart failure) ([place]), COPD (chronic obstructive pulmonary disease) ([place]), Dupuytren's contracture of both hands, Dysrhythmia, GAD (generalized anxiety disorder), Hepatitis C, History of atrial fibrillation (11/11/2021), History of CVA (cerebrovascular accident) (11/11/2021), History of MI (myocardial infarction) (11/13/2021), Hypertension, Osteoarthritis, and Stroke ([place]).   Significant Hospital Events: Including procedures, antibiotic start and stop dates in addition to other pertinent events   6/17 admitted with C5-C6 compression requiring emergent decompression 6/19 acute respiratory distress, free air on CT abd/pelvis, found to have perforated sigmoid diverticulitis  Interim History / Subjective:  Arrived to the ICU on propofol and neo, developed diffuse erythematous rash during OR course after Zosyn.  Had also received Unasyn and Ancef during hospital course.  Pt was difficult to oxygenate and ventilate per CRNA  Objective   Blood pressure 113/68, pulse 82,  temperature (!) 97.4 F (36.3 C), temperature source Oral, resp. rate (!) 22, height 5\' 6"  (1.676 m), weight 83 kg, SpO2 93 %.        Intake/Output Summary (Last 24 hours) at 11/12/2021 2029 Last data filed at 11/02/2021 2015 Gross per 24 hour  Intake 1550 ml  Output 725 ml  Net 825 ml   Filed Weights   11/01/2021 1302 11/08/2021 1310  Weight: 83 kg 83 kg   General:  critically ill-appearing elderly M, intubated and sedated HEENT: MM pink/moist, ETT in place, diffuse erythema of the face Neuro: examined on sedation, RASS -4, pupils equal CV: s1s2 rrr, no m/r/g PULM:  decreased air movement in bilateral bases with scattered rhonchi, requiring 100% FiO2 and PEEP 10 GI: soft, new ostomy in place, no bleeding from surgical site, no BS  Extremities: warm/dry, no edema  Skin: diffuse macular erythematous rash of the trunk and groin  Resolved Hospital Problem list     Assessment & Plan:    Acute Hypoxic and Hypercarbic Respiratory Failure secondary to likely aspiration Pneumonia and underlying COPD -PEEP increased to 10, 100% FiO2 in the OR, increased RR resulted in auto-peep -repeat CXR and ABG pending -continue Zosyn -send respiratory culture -Fentanyl and Propofol --Maintain full vent support with SAT/SBT as tolerated -titrate Vent setting to maintain SpO2 greater than or equal to 90%. -HOB elevated 30 degrees. -Plateau pressures less than 30 cm H20.  -Follow chest x-ray, ABG prn.   -Bronchial hygiene and RT/bronchodilator protocol.     Pneumoperitoneal secondary to perforated Sigmoid Diverticula  Possible septic shock -s/p emergent ex-lap and ostomy creation, post-op management per general surgery -NPO -continue Zosyn -gently IVF -trend lactic acid -blood cultures -peripheral Levophed to maintain MAP >65   Erythematous  Rash Most likely allergic, unclear etiology, on trunk, groin and face, sparing extremities -try Benadryl 50mg  and continue Solumedrol  Recent C5-C6  Compression with recent emergent decompression -post-op management per NS, on Solumedrol   AKI Creatinine rise from 1.16 to 1.29 since admission -IVF and monitor renal indices, UOP and electrolytes, avoid nephrotoxins as able   History of Atrial Fibrillation and Chronic HFpEF -home medications and eliquis held in the setting of the above -last echo in May with preserved EF   Best Practice (right click and "Reselect all SmartList Selections" daily)   Diet/type: NPO DVT prophylaxis: SCD GI prophylaxis: PPI Lines: N/A Foley:  Yes, and it is still needed Code Status:  full code Last date of multidisciplinary goals of care discussion [pending]  Labs   CBC: Recent Labs  Lab 11/16/2021 0756 11/18/21 0315 11/18/21 1753 November 25, 2021 0337  WBC 10.8* 17.0* 16.9* 14.4*  NEUTROABS 8.1*  --   --   --   HGB 11.7* 12.3* 11.1* 10.8*  HCT 33.1* 34.3* 30.8* 31.2*  MCV 91.4 90.0 90.9 93.4  PLT 144* 149* 124* 116*    Basic Metabolic Panel: Recent Labs  Lab 11/11/2021 0756 11/18/21 0315 25-Nov-2021 0337  NA 135 135 133*  K 3.5 4.0 4.4  CL 105 100 104  CO2 23 24 21*  GLUCOSE 102* 169* 130*  BUN 11 17 33*  CREATININE 1.16 1.20 1.29*  CALCIUM 7.7* 8.8* 8.5*   GFR: Estimated Creatinine Clearance: 54.7 mL/min (A) (by C-G formula based on SCr of 1.29 mg/dL (H)). Recent Labs  Lab 11/04/2021 0756 11/18/21 0315 11/18/21 1753 11/25/21 0337 11/25/21 1649  WBC 10.8* 17.0* 16.9* 14.4*  --   LATICACIDVEN  --   --   --   --  2.3*    Liver Function Tests: Recent Labs  Lab 11/07/2021 0756 11/18/21 0315 11-25-21 0337  AST 14* 19 25  ALT 15 19 15   ALKPHOS 30* 33* 26*  BILITOT 0.5 1.1 1.1  PROT 4.4* 5.3* 4.9*  ALBUMIN 2.8* 3.1* 2.8*   No results for input(s): "LIPASE", "AMYLASE" in the last 168 hours. No results for input(s): "AMMONIA" in the last 168 hours.  ABG    Component Value Date/Time   HCO3 24.0 Nov 25, 2021 0928   ACIDBASEDEF 0.2 11/25/21 0928   O2SAT 75.9 Nov 25, 2021 0928      Coagulation Profile: Recent Labs  Lab Nov 25, 2021 1649  INR 1.2    Cardiac Enzymes: No results for input(s): "CKTOTAL", "CKMB", "CKMBINDEX", "TROPONINI" in the last 168 hours.  HbA1C: No results found for: "HGBA1C"  CBG: No results for input(s): "GLUCAP" in the last 168 hours.  Review of Systems:   Unable to obtain  Past Medical History:  He,  has a past medical history of A-fib (H[facility]), CHF (congestive heart failure) (H[facility]), COPD (chronic obstructive pulmonary disease) (H[facility]), Dupuytren's contracture of both hands, Dysrhythmia, GAD (generalized anxiety disorder), Hepatitis C, History of atrial fibrillation (11/30/2021), History of CVA (cerebrovascular accident) (11/15/2021), History of MI (myocardial infarction) (11/16/2021), Hypertension, Osteoarthritis, and Stroke (HCC).   Surgical History:   Past Surgical History:  Procedure Laterality Date   ANTERIOR CERVICAL DECOMP/DISCECTOMY FUSION N/A 11/06/2021   Procedure: ANTERIOR CERVICAL DECOMPRESSION/DISCECTOMY FUSION  Cervical Five - Cervical Six;  Surgeon: 11/15/2021, MD;  Location: [facility] OR;  Service: Neurosurgery;  Laterality: N/A;   HERNIA REPAIR     TENDON REPAIR Right    Shoulder     Social History:   reports that he has been smoking cigarettes. He has  never used smokeless tobacco. He reports current drug use. Drug: Marijuana. He reports that he does not drink alcohol.   Family History:  His family history is not on file.   Allergies No Known Allergies   Home Medications  Prior to Admission medications   Medication Sig Start Date End Date Taking? Authorizing Provider  apixaban (ELIQUIS) 5 MG TABS tablet Take 5 mg by mouth 2 (two) times daily.   Yes [provider]  atorvastatin (LIPITOR) 40 MG tablet Take 40 mg by mouth daily.   Yes [provider]  furosemide (LASIX) 20 MG tablet Take 20 mg by mouth daily as needed for fluid or edema.   Yes [provider]  methocarbamol (ROBAXIN) 500 MG  tablet Take 500 mg by mouth 3 (three) times daily as needed for muscle spasms.   Yes [provider]  metoprolol succinate (TOPROL-XL) 50 MG 24 hr tablet Take 50 mg by mouth daily. Take with or immediately following a meal.   Yes [provider]  sacubitril-valsartan (ENTRESTO) 24-26 MG Take 1 tablet by mouth 2 (two) times daily.   Yes [provider]     Critical care time: 50 minutes    CRITICAL CARE Performed by: Darcella Gasman Alvina Strother   Total critical care time: 50 minutes  Critical care time was exclusive of separately billable procedures and treating other patients.  Critical care was necessary to treat or prevent imminent or life-threatening deterioration.  Critical care was time spent personally by me on the following activities: development of treatment plan with patient and/or surrogate as well as nursing, discussions with consultants, evaluation of patient's response to treatment, examination of patient, obtaining history from patient or surrogate, ordering and performing treatments and interventions, ordering and review of laboratory studies, ordering and review of radiographic studies, pulse oximetry and re-evaluation of patient's condition.  Darcella Gasman Jermanie Minshall, PA-C [facility] See Amion for pager If no response to pager , please call [phone] until 7pm After 7:00 pm call Elink  [phone]

## 2021-11-19 NOTE — Transfer of Care (Signed)
Immediate Anesthesia Transfer of Care Note  Patient: Stephen Whitney  Procedure(s) Performed: EXPLORATORY LAPAROTOMY SIGMOID COLECTOMY COLOSTOMY  Patient Location: ICU  Anesthesia Type:General  Level of Consciousness: sedated and Patient remains intubated per anesthesia plan  Airway & Oxygen Therapy: Patient remains intubated per anesthesia plan and Patient placed on Ventilator (see vital sign flow sheet for setting)  Post-op Assessment: Report given to RN and Post -op Vital signs reviewed and stable  Post vital signs: Reviewed and stable  Last Vitals:  Vitals Value Taken Time  BP    Temp    Pulse    Resp    SpO2 92 % 11/09/2021 2039    Last Pain:  Vitals:   11/22/2021 1234  TempSrc:   PainSc: 8       Patients Stated Pain Goal: 3 (11/18/21 0435)  Complications: No notable events documented.

## 2021-11-19 NOTE — Progress Notes (Signed)
Cancellation Note    11/06/2021 1532  PT Visit Information  Last PT Received On 11/20/2021  Reason Eval/Treat Not Completed Patient at procedure or test/unavailable  Restrictions  Weight Bearing Restrictions No   Pt gone to CT on arrival. Not sure that I will make it back as of this note.   11/08/2021  Jacinto Halim., PT Acute Rehabilitation Services [phone]  (pager) [phone]  (office)

## 2021-11-19 NOTE — Anesthesia Procedure Notes (Addendum)
Arterial Line Insertion Start/End2023-06-20 6:50 PM, 20-Nov-2021 6:56 PM  Patient location: OR. Preanesthetic checklist: patient identified and IV checked Right, radial was placed Catheter size: 20 G Hand hygiene performed  and maximum sterile barriers used   Attempts: 2 Procedure performed without using ultrasound guided technique. Following insertion, dressing applied and Biopatch.

## 2021-11-19 NOTE — Progress Notes (Signed)
Pt stated he was having difficulty breathing MD and RT paged RT bedside now

## 2021-11-19 NOTE — Anesthesia Preprocedure Evaluation (Addendum)
Anesthesia Evaluation  Patient identified by MRN, date of birth, ID band Patient awake    Reviewed: Allergy & Precautions, NPO status , Patient's Chart, lab work & pertinent test results, reviewed documented beta blocker date and time   Airway Mallampati: II  TM Distance: >3 FB Neck ROM: Limited    Dental  (+) Dental Advisory Given, Poor Dentition, Missing, Chipped   Pulmonary COPD, Current Smoker and Patient abstained from smoking.,     + decreased breath sounds      Cardiovascular hypertension, Pt. on home beta blockers + Past MI and +CHF  Normal cardiovascular exam+ dysrhythmias Atrial Fibrillation  Rhythm:Regular Rate:Normal     Neuro/Psych PSYCHIATRIC DISORDERS Anxiety C5-C6 spinal cord compression requiring emergent decompression by [place] on 6/17 CVA    GI/Hepatic (+)     substance abuse  marijuana use, Hepatitis -, CPneumoperitoneum   Endo/Other  negative endocrine ROS  Renal/GU negative Renal ROS     Musculoskeletal  (+) Arthritis ,   Abdominal   Peds  Hematology  (+) Blood dyscrasia (Eliquis; Thrombocytopenia), anemia ,   Anesthesia Other Findings Day of surgery medications reviewed with the patient.  Reproductive/Obstetrics                           Anesthesia Physical Anesthesia Plan  ASA: 3 and emergent  Anesthesia Plan: General   Post-op Pain Management: Ofirmev IV (intra-op)*   Induction: Intravenous, Rapid sequence and Cricoid pressure planned  PONV Risk Score and Plan: 2 and Dexamethasone and Ondansetron  Airway Management Planned: Oral ETT and Video Laryngoscope Planned  Additional Equipment: Arterial line  Intra-op Plan:   Post-operative Plan: Possible Post-op intubation/ventilation  Informed Consent: I have reviewed the patients History and Physical, chart, labs and discussed the procedure including the risks, benefits and alternatives for the proposed  anesthesia with the patient or authorized representative who has indicated his/her understanding and acceptance.     Dental advisory given  Plan Discussed with: CRNA  Anesthesia Plan Comments:       Anesthesia Quick Evaluation

## 2021-11-19 NOTE — Progress Notes (Signed)
RT attempted x 2 to obtain ABG sample and was unsuccessful. Pt is able to tell me where he is and what his name is at this time. MD made aware and is ordering VBG instead.

## 2021-11-19 NOTE — Progress Notes (Signed)
  Transition of Care [facility]) Screening Note   Patient Details  Name: Stephen Whitney Date of Birth: 1950/10/21   Transition of Care [facility]) CM/SW Contact:    Kermit Balo, RN Phone Number: 11/09/2021, 1:41 PM  Patient is from home with spouse. Family deciding on CIR vs SNF.  Transition of Care Department [facility]) has reviewed patient. We will continue to monitor patient advancement through interdisciplinary progression rounds. If new patient transition needs arise, please place a TOC consult.

## 2021-11-19 NOTE — Progress Notes (Addendum)
HD#2 SUBJECTIVE:  Patient Summary:  Stephen Whitney is a 71 y.o. M with pertinent PMH of CVA, MI, CHF, COPD, osteoarthritis, HLD who presents with paraplegia and hypoesthesia and admitted for C5-C6 spinal cord compression requiring emergent decompression.   Overnight Events: None  Interim History: Patient experienced desaturation with dyspnea prior to our exam, resulting in RT responding and placing on NRB. He has not had recurrence of severe nausea since yesterday.  OBJECTIVE:  Vital Signs: Vitals:   11/07/2021 0507 11/14/2021 0814 11/18/2021 0834 11/13/2021 0851  BP: 113/67 100/63    Pulse:  96 95 82  Resp: 18 18 20 16   Temp: 99.8 F (37.7 C) (!) 97.4 F (36.3 C)    TempSrc: Oral Oral    SpO2: 93% (!) 81% 98% 97%  Weight:      Height:       Supplemental O2: Room Air SpO2: 97 % O2 Flow Rate (L/min): 15 L/min  Filed Weights   2021/12/14 1302 Dec 14, 2021 1310  Weight: 83 kg 83 kg     Intake/Output Summary (Last 24 hours) at 11/10/2021 1142 Last data filed at 11/03/2021 1100 Gross per 24 hour  Intake --  Output 1700 ml  Net -1700 ml    Net IO Since Admission: -2,351.84 mL [11/27/2021 1142]  Physical Exam: Constitutional:Acutely ill gentleman resting on NRB. HENT: Closed surgical incision without increased warmth or erythema over anterior neck. Cardio:Regular rate and rhythm. Pulm:On NRB, satting upper-90's. Increased accessory muscle use. 11/21/21 for extremity edema. Skin:Skin is warm and dry. Neuro: Mental Status: Patient is awake, alert, oriented x3 No signs of aphasia or neglect Cranial Nerves: III,IV, VI: EOMI without ptosis or diploplia.  V: Facial sensation is symmetric to light touch and temperature. VII: Facial movement is symmetric.  VIII: Hearing is intact to voice XI: Shoulder shrug is symmetric. Motor: Bilateral UE 4/5, bilateral LE 0/5 despite maximal effort. Does respond to toe pinching. Sensory: Sensation is diminished in bilateral UE, nonexistent in  bilateral LE. Psych:Anxious-appearing.   Patient Lines/Drains/Airways Status     Active Line/Drains/Airways     Name Placement date Placement time Site Days   Peripheral IV Dec 14, 2021 18 G Right Antecubital 14-Dec-2021  0739  Antecubital  1   Peripheral IV December 14, 2021 18 G 1.16" Left;Distal Forearm 2021-12-14  1629  Forearm  1   Incision (Closed) Dec 14, 2021 Neck Dec 14, 2021  1746  -- 1             ASSESSMENT/PLAN:  Assessment: Principal Problem:   Spinal cord compression (HCC) Active Problems:   History of atrial fibrillation   Osteoarthritis   CHF (congestive heart failure) (HCC)   Hypertension   Plan:  Acute hypoxic respiratory failure Patient experienced an acute hypoxic event this morning and is now on NRB, satting appropriately. On exam he is in respiraotry distress using accessory muscles. Lungs with decreased air movement throughout and bibasilar wheezing. CXR was obtained which showed apparent free air under the right hemidiaphragm, volume loss in the right hemithorax with patchy right lung base opacity, mildly worsened, and possible trace left pleural effusion. Differential is broad as patient  is high aspiration risk and has history of COPD and CHF, as well as recent cervical spine surgery with anterior approach. He does not appear volume overloaded on exam. He did have a brief period of coffee-ground emesis morning of 06/18 and though he does not have a known history of ulcer, if he were to have an ulcer that has perforated, typical peritonitic findings  may not be present given his hypoesthesia from the chest, down. Though less likely, there is a chance that he experienced esophageal damage given anterior surgical approach. -STAT CT chest/abdomen/pelvis -Supplemental oxygen; wean as tolerated -Unasyn 1.5 g IV q6h -Duoneb q4h -Solu-medrol 40 mg IV daily  C5-C6 herniated disc s/p urgent anterior cervical decompression and fixation  Osteoarthritis Presented with acute quadriplegia  and myelopathy. POD 2 from emergent decompression at C5-6. Afebrile, hemodynamically stable. Leukocytosis improved to 14.4. Neurological exam is unchanged.  -Neurosurgery consulted, appreciate their assistance -PT/OT; hopeful for CIR -Trend CBC -Zofran, phenergan -Pain control  Normocytic anemia Hgb stable, 10.8. Eliquis was restarted overnight 06/18. He denies recurrence of nausea or vomiting. -Trend CBC  Urinary retention -Continue foley catheter   Hypertension Normotensive. Managed outpatient with metoprolol. -Holding home metoprolol   Hx atrial fibrillation Managed outpatient with Eliquis. -Continue Eliquis -Tele   Hx HFrEF No signs of volume overload at this time.  -Resume Lasix as indicated -Continue Entresto   Hx CVA Hx HLD -Continue home atorvastatin    Best Practice: Diet: Regular diet IVF: None VTE: SCD's Start: 11/24/2021 1945 SCDs Start: 11/16/2021 1234 Code: Full AB: None Therapy Recs: Pending Family Contact: Debra, called and notified. DISPO: Anticipated discharge pending Medical stability.  Signature: Champ Mungo, D.O.  Internal Medicine Resident, PGY-1 [facility]  Pager: [phone] 11:42 AM, 12-10-2021   Please contact the on call pager after 5 pm and on weekends at [phone].

## 2021-11-19 NOTE — Consult Note (Addendum)
[facility] Surgery Consult Note  Stephen Whitney 01-27-1951  [id number].    Requesting MD: Dr. Velna Ochs  Chief Complaint/Reason for Consult: Pneumoperitoneum  HPI:  Stephen Whitney is a 71 y.o. male who presented to [facility] ED on 6/17 for bilateral leg weakness and neck pain. He was found to have C5-C6 spinal cord compression requiring emergent decompression by [place] on 6/17. 6/18 developed urinary retention requiring foley as well as n/v. Developed acute respiratory distress with hypoxic respiratory failure overnight requiring NRB. A CXR was obtained that showed possible PNA but also question of free air under the right hemidiaphragm. CT A/P was obtained and showed pneumoperitoneum with extraluminal air locules in the small bowel mesentery adjacent to a decompressed, diverticular appearing loop of distal small bowel in the central abdomen. WBC 14.4.  Currently afebrile without tachycardia or hypotension.  Not on pressors. Lactic pending. He currently denies any abdominal pain. Only complaint at this time is sob. He did have some n/v yesterday.  PMH significant for A. Fib on Eliquis (last dose 6/19 at 1010am), CHF (10/10/21 Echo on care everywhere with EF 55-60%), COPD, Hep C, CAD w/ prior MI, CVA and HTN Abdominal surgical history: hernia repair Anticoagulants: Eliquis (last dose 6/19 at 1010am) Every day smoker Tbcc No Alc use Smokes marijuana   History reviewed. No pertinent family history.  Past Medical History:  Diagnosis Date   A-fib [facility])    CHF (congestive heart failure) (HCC)    COPD (chronic obstructive pulmonary disease) ([place])    Dupuytren's contracture of both hands    Dysrhythmia    GAD (generalized anxiety disorder)    Hepatitis C    History of atrial fibrillation 11/16/2021   History of CVA (cerebrovascular accident) 11/19/2021   History of MI (myocardial infarction) 11/25/2021   Hypertension    Osteoarthritis    Stroke [facility])     Past Surgical History:   Procedure Laterality Date   ANTERIOR CERVICAL DECOMP/DISCECTOMY FUSION N/A 11/11/2021   Procedure: ANTERIOR CERVICAL DECOMPRESSION/DISCECTOMY FUSION  Cervical Five - Cervical Six;  Surgeon: Kristeen Miss, MD;  Location: [facility];  Service: Neurosurgery;  Laterality: N/A;   HERNIA REPAIR     TENDON REPAIR Right    Shoulder    Social History:  reports that he has been smoking cigarettes. He has never used smokeless tobacco. He reports current drug use. Drug: Marijuana. He reports that he does not drink alcohol.  Allergies: No Known Allergies  Medications Prior to Admission  Medication Sig Dispense Refill   apixaban (ELIQUIS) 5 MG TABS tablet Take 5 mg by mouth 2 (two) times daily.     atorvastatin (LIPITOR) 40 MG tablet Take 40 mg by mouth daily.     furosemide (LASIX) 20 MG tablet Take 20 mg by mouth daily as needed for fluid or edema.     methocarbamol (ROBAXIN) 500 MG tablet Take 500 mg by mouth 3 (three) times daily as needed for muscle spasms.     metoprolol succinate (TOPROL-XL) 50 MG 24 hr tablet Take 50 mg by mouth daily. Take with or immediately following a meal.     sacubitril-valsartan (ENTRESTO) 24-26 MG Take 1 tablet by mouth 2 (two) times daily.      Prior to Admission medications   Medication Sig Start Date End Date Taking? Authorizing Provider  apixaban (ELIQUIS) 5 MG TABS tablet Take 5 mg by mouth 2 (two) times daily.   Yes [provider]  atorvastatin (LIPITOR) 40 MG tablet Take 40  mg by mouth daily.   Yes [provider]  furosemide (LASIX) 20 MG tablet Take 20 mg by mouth daily as needed for fluid or edema.   Yes [provider]  methocarbamol (ROBAXIN) 500 MG tablet Take 500 mg by mouth 3 (three) times daily as needed for muscle spasms.   Yes [provider]  metoprolol succinate (TOPROL-XL) 50 MG 24 hr tablet Take 50 mg by mouth daily. Take with or immediately following a meal.   Yes [provider]  sacubitril-valsartan  (ENTRESTO) 24-26 MG Take 1 tablet by mouth 2 (two) times daily.   Yes [provider]    Blood pressure 100/63, pulse 82, temperature (!) 97.4 F (36.3 C), temperature source Oral, resp. rate 16, height 5\' 6"  (1.676 m), weight 83 kg, SpO2 96 %. Physical Exam: General: acutely ill appearing male HEENT: neck incision cdi Heart: regular, rate, and rhythm. Palpable pedal pulses bilaterally Lungs: increased work of breathing Abd: mild distension but soft, nontender, +BS MS: no BUE/BLE edema, calves soft and nontender Skin: warm and dry with no masses, lesions, or rashes Psych: A&Ox3 but lethargic Neuro: paraplegic BLE  Results for orders placed or performed during the hospital encounter of 11/12/2021 (from the past 48 hour(s))  HIV Antibody (routine testing w rflx)     Status: None   Collection Time: 11/18/21  3:15 AM  Result Value Ref Range   HIV Screen 4th Generation wRfx Non Reactive Non Reactive    Comment: Performed at [facility], [address], [place], [place]  Comprehensive metabolic panel     Status: Abnormal   Collection Time: 11/18/21  3:15 AM  Result Value Ref Range   Sodium 135 135 - 145 mmol/L   Potassium 4.0 3.5 - 5.1 mmol/L   Chloride 100 98 - 111 mmol/L   CO2 24 22 - 32 mmol/L   Glucose, Bld 169 (H) 70 - 99 mg/dL    Comment: Glucose reference range applies only to samples taken after fasting for at least 8 hours.   BUN 17 8 - 23 mg/dL   Creatinine, Ser 11/20/21 0.61 - 1.24 mg/dL   Calcium 8.8 (L) 8.9 - 10.3 mg/dL   Total Protein 5.3 (L) 6.5 - 8.1 g/dL   Albumin 3.1 (L) 3.5 - 5.0 g/dL   AST 19 15 - 41 U/L   ALT 19 0 - 44 U/L   Alkaline Phosphatase 33 (L) 38 - 126 U/L   Total Bilirubin 1.1 0.3 - 1.2 mg/dL   GFR, Estimated 6.04 >54 mL/min    Comment: (NOTE) Calculated using the CKD-EPI Creatinine Equation (2021)    Anion gap 11 5 - 15    Comment: Performed at [facility], [address], [place], [place]  CBC     Status:  Abnormal   Collection Time: 11/18/21  3:15 AM  Result Value Ref Range   WBC 17.0 (H) 4.0 - 10.5 K/uL   RBC 3.81 (L) 4.22 - 5.81 MIL/uL   Hemoglobin 12.3 (L) 13.0 - 17.0 g/dL   HCT 11/20/21 (L) 47.8 - 29.5 %   MCV 90.0 80.0 - 100.0 fL   MCH 32.3 26.0 - 34.0 pg   MCHC 35.9 30.0 - 36.0 g/dL   RDW 62.1 30.8 - 65.7 %   Platelets 149 (L) 150 - 400 K/uL   nRBC 0.0 0.0 - 0.2 %    Comment: Performed at [facility], [address] [place],  [place] [address]  CBC     Status: Abnormal   Collection Time: 11/18/21  5:53 PM  Result Value Ref Range   WBC 16.9 (H) 4.0 - 10.5 K/uL   RBC 3.39 (L) 4.22 - 5.81 MIL/uL   Hemoglobin 11.1 (L) 13.0 - 17.0 g/dL   HCT 19.1 (L) 47.8 - 29.5 %   MCV 90.9 80.0 - 100.0 fL   MCH 32.7 26.0 - 34.0 pg   MCHC 36.0 30.0 - 36.0 g/dL   RDW 62.1 30.8 - 65.7 %   Platelets 124 (L) 150 - 400 K/uL    Comment: Immature Platelet Fraction may be clinically indicated, consider ordering this additional test [id number] REPEATED TO VERIFY    nRBC 0.0 0.0 - 0.2 %    Comment: Performed at [facility], [address], [place], [place] [address]  CBC     Status: Abnormal   Collection Time: 11/25/2021  3:37 AM  Result Value Ref Range   WBC 14.4 (H) 4.0 - 10.5 K/uL   RBC 3.34 (L) 4.22 - 5.81 MIL/uL   Hemoglobin 10.8 (L) 13.0 - 17.0 g/dL   HCT 24.4 (L) 01.0 - 27.2 %   MCV 93.4 80.0 - 100.0 fL   MCH 32.3 26.0 - 34.0 pg   MCHC 34.6 30.0 - 36.0 g/dL   RDW 53.6 64.4 - 03.4 %   Platelets 116 (L) 150 - 400 K/uL    Comment: Immature Platelet Fraction may be clinically indicated, consider ordering this additional test [id number] REPEATED TO VERIFY PLATELET COUNT CONFIRMED BY SMEAR    nRBC 0.0 0.0 - 0.2 %    Comment: Performed at [facility], [address] [place], [place] [address]  Comprehensive metabolic panel     Status: Abnormal   Collection Time: 11/18/2021  3:37 AM  Result Value Ref Range   Sodium 133 (L) 135 - 145 mmol/L   Potassium 4.4 3.5 - 5.1 mmol/L    Chloride 104 98 - 111 mmol/L   CO2 21 (L) 22 - 32 mmol/L   Glucose, Bld 130 (H) 70 - 99 mg/dL    Comment: Glucose reference range applies only to samples taken after fasting for at least 8 hours.   BUN 33 (H) 8 - 23 mg/dL   Creatinine, Ser 3.32 (H) 0.61 - 1.24 mg/dL   Calcium 8.5 (L) 8.9 - 10.3 mg/dL   Total Protein 4.9 (L) 6.5 - 8.1 g/dL   Albumin 2.8 (L) 3.5 - 5.0 g/dL   AST 25 15 - 41 U/L   ALT 15 0 - 44 U/L   Alkaline Phosphatase 26 (L) 38 - 126 U/L   Total Bilirubin 1.1 0.3 - 1.2 mg/dL   GFR, Estimated >95 >18 mL/min    Comment: (NOTE) Calculated using the CKD-EPI Creatinine Equation (2021)    Anion gap 8 5 - 15    Comment: Performed at [facility], [address], [place], [place] [address]  Blood gas, venous     Status: Abnormal   Collection Time: 11/05/2021  9:28 AM  Result Value Ref Range   pH, Ven 7.43 7.25 - 7.43   pCO2, Ven 36 (L) 44 - 60 mmHg   pO2, Ven 42 32 - 45 mmHg   Bicarbonate 24.0 20.0 - 28.0 mmol/L   Acid-base deficit 0.2 0.0 - 2.0 mmol/L   O2 Saturation 75.9 %   Patient temperature 36.4    Collection site RIGHTWRIST    Drawn by [id number]  Comment: Performed at [facility], [place] [address], [place], [place] [address]   CT CHEST ABDOMEN PELVIS W CONTRAST  Result Date: 11/09/2021 CLINICAL DATA:  Peritonitis or perforation suspected EXAM: CT CHEST, ABDOMEN, AND PELVIS WITH CONTRAST TECHNIQUE: Multidetector CT imaging of the chest, abdomen and pelvis was performed following the standard protocol during bolus administration of intravenous contrast. RADIATION DOSE REDUCTION: This exam was performed according to the departmental dose-optimization program which includes automated exposure control, adjustment of the mA and/or kV according to patient size and/or use of iterative reconstruction technique. CONTRAST:  170mL OMNIPAQUE IOHEXOL 300 MG/ML  SOLN COMPARISON:  None Available. FINDINGS: CT CHEST FINDINGS Cardiovascular: Aortic atherosclerosis. Normal  heart size. Left coronary artery calcifications. No pericardial effusion. Mediastinum/Nodes: No enlarged mediastinal, hilar, or axillary lymph nodes. Thyroid gland, trachea, and esophagus demonstrate no significant findings. Lungs/Pleura: Examination of the lungs is limited by breath motion artifact within this limitation, complete consolidation or atelectasis of the right lower and right middle lobes with air bronchograms and a small associated pleural effusion. Trace left pleural effusion. Musculoskeletal: No chest wall mass or suspicious osseous lesions identified. CT ABDOMEN PELVIS FINDINGS Hepatobiliary: No solid liver abnormality is seen. No gallstones, gallbladder wall thickening, or biliary dilatation. Pancreas: Unremarkable. No pancreatic ductal dilatation or surrounding inflammatory changes. Spleen: Normal in size without significant abnormality. Adrenals/Urinary Tract: Adrenal glands are unremarkable. Severely atrophic right kidney. The left kidney is normal, without renal calculi, solid lesion, or hydronephrosis. Foley catheter in the urinary bladder. Stomach/Bowel: Stomach is within normal limits. Appendix is normal. Extraluminal air locules in the small bowel mesentery adjacent to a decompressed, diverticular appearing loop of distal small bowel in the central abdomen (series 3, image 101, series 7, image 50). Descending and sigmoid diverticulosis. Vascular/Lymphatic: No significant vascular findings are present. No enlarged abdominal or pelvic lymph nodes. Reproductive: No mass or other abnormality. Other: Fat containing umbilical hernia. Anasarca. Small volume pneumoperitoneum. Musculoskeletal: No acute osseous findings. IMPRESSION: 1. Small volume pneumoperitoneum. Extraluminal air locules in the small bowel mesentery adjacent to a decompressed, diverticular appearing loop of distal small bowel in the central abdomen, suggesting nidus of bowel perforation. No distended bowel loops or evidence of  obstruction. 2. Examination of the lungs is limited by breath motion artifact. Within this limitation, complete consolidation or atelectasis of the right lower and right middle lobes with air bronchograms and a small associated pleural effusion. Findings suggest infection or aspiration. 3. Coronary artery disease. Aortic Atherosclerosis (ICD10-I70.0). Electronically Signed   By: Delanna Ahmadi M.D.   On: 11/04/2021 15:41   DG Chest 2 View  Addendum Date: 11/14/2021   ADDENDUM REPORT: 11/18/2021 11:06 ADDENDUM: Critical Value/emergent results were called by telephone at the time of interpretation on 11/15/2021 at 11:06 am to provider DR. DEAN, who verbally acknowledged these results. Electronically Signed   By: Ilona Sorrel M.D.   On: 11/04/2021 11:06   Result Date: 11/03/2021 CLINICAL DATA:  Dyspnea EXAM: CHEST - 2 VIEW COMPARISON:  11/27/2021 chest radiograph. FINDINGS: Motion degraded lateral view. Stable cardiomediastinal silhouette with normal heart size. No pneumothorax. Volume loss in the right hemithorax with rightward mediastinal shift, slightly worsened. Patchy right lung base opacity is slightly increased. Minimal left basilar atelectasis is stable. No pulmonary edema. Apparent free air under the right hemidiaphragm. Possible trace left pleural effusion. IMPRESSION: 1. Apparent free air under the right hemidiaphragm, cannot exclude bowel perforation. CT abdomen/pelvis with oral and IV contrast recommended for further evaluation. 2. Volume loss in the  right hemithorax with patchy right lung base opacity, mildly worsened., likely atelectasis, cannot exclude a component of aspiration or pneumonia. 3. Possible trace left pleural effusion. Electronically Signed: By: Ilona Sorrel M.D. On: 11/30/2021 10:55   DG CHEST PORT 1 VIEW  Addendum Date: 11/23/2021   ADDENDUM REPORT: 11/02/2021 09:11 ADDENDUM: Relative lucency along the right diaphragm adjacent to the area of airspace disease likely reflecting  partially aerated lung, but if there is concern for pneumoperitoneum recommend an abdominal series for further evaluation. These results were called by telephone at the time of interpretation on 11/25/2021 at 9:11 am to provider Velna Ochs , who verbally acknowledged these results. Electronically Signed   By: Kathreen Devoid M.D.   On: 11/05/2021 09:11   Result Date: 11/27/2021 CLINICAL DATA:  Dyspnea EXAM: PORTABLE CHEST 1 VIEW COMPARISON:  None Available. FINDINGS: Right lower lobe airspace disease concerning for pneumonia. No pleural effusion or pneumothorax. Heart and mediastinal contours are unremarkable. No acute osseous abnormality. IMPRESSION: 1. Right lower lobe airspace disease concerning for pneumonia. Electronically Signed: By: Kathreen Devoid M.D. On: 11/18/2021 08:54   DG Cervical Spine 2 or 3 views  Result Date: 11/13/2021 CLINICAL DATA:  [id number]. Surgery: ANTERIOR CERVICAL DECOMPRESSION/DISCECTOMY FUSION Cervical Five - Cervical Six RSTO: CLW Portable xrays taken EXAM: CERVICAL SPINE - 2-3 VIEW COMPARISON:  MRI cervical spine 11/23/2021 FINDINGS: Partially visualized cervical spine lateral view down to the C5 level. Surgical hardware noted anteriorly at the C4 through C6 levels. Endotracheal tube partially visualized. IMPRESSION: Surgical hardware noted anteriorly at the C4 through C6 levels. Electronically Signed   By: Iven Finn M.D.   On: 11/16/2021 18:13    Anti-infectives (From admission, onward)    Start     Dose/Rate Route Frequency Ordered Stop   11/05/2021 1015  ampicillin-sulbactam (UNASYN) 1.5 g in sodium chloride 0.9 % 100 mL IVPB        1.5 g 200 mL/hr over 30 Minutes Intravenous Every 6 hours 11/02/2021 0918 11/24/21 0959   11/18/2021 2030  ceFAZolin (ANCEF) IVPB 2g/100 mL premix        2 g 200 mL/hr over 30 Minutes Intravenous Every 8 hours 11/27/2021 1944 11/18/21 0507   11/21/2021 1312  ceFAZolin (ANCEF) 2-4 GM/100ML-% IVPB       Note to Pharmacy: [facility]: cabinet  override      11/03/2021 1312 11/18/21 0129        Assessment/Plan Pneumoperitoneum  - Patient has been seen and examined. Vitals, labs, I/O, imaging, and notes reviewed. This is a 71 y.o. male who was admitted with C5-C6 spinal cord compression requiring emergent decompression. He developed n/v and sob overnight. A CXR to eval his sob showed possible air under the right hemidiaphragm prompting CT A/P to be obtained that showed showed pneumoperitoneum with extraluminal air locules in the small bowel mesentery adjacent to a decompressed, diverticular appearing loop of distal small bowel in the central abdomen.  His WBC is elevated at 14.4.  Vitals are currently stable - afebrile without tachycardia or hypotension and not on pressors.  Lactic pending.  The patient denies any current abdominal pain is completely nontender on exam, but he is paraplegic.  The patient did receive morning dose of Eliquis and is not NPO, and is on steroids.  Discussed case with my attending and we recommend emergent exploratory laparotomy tonight. Will make him NPO and reverse Eliquis. Start broad spectrum abx (currently on Unasyn, recommend broadening to Zosyn). We will follow with you  ID - On Unasyn for PNA, broadening to Zosyn for better intra-abdominal coverage VTE - SCDs, hold Eliquis  FEN - Make NPO, IVF per primary (not currently on fluids)  Foley - In place for retention.   PNA on Abx A. Fib on Eliquis (last dose 6/19 at 1010am) CHF (10/10/21 Echo on care everywhere with EF 55-60%) COPD Hep C CAD w/ prior MI CVA  HTN Urinary retention with foley  I reviewed hospitalist notes, last 24 h vitals and pain scores, last 48 h intake and output, last 24 h labs and trends, and last 24 h imaging results.   Margie Billet, PA-C [facility] 11/27/2021, 4:01 PM Please see Amion for pager number during day hours 7:00am-4:30pm

## 2021-11-19 NOTE — Progress Notes (Signed)
Pharmacy Antibiotic Note  Stephen Whitney is a 71 y.o. male admitted on 11/13/2021 with  Intra-abdominal infection .  Pharmacy has been consulted for Zosyn dosing. CT A/P with pneumoperitoneum. Suspected perforated small bowel diverticulum, anticoagulation being reversed for ex lap tonight. Last dose of Eliquis on 6/19 at 1010. Was started on Unasyn this morning, surgery would like to change to Zosyn for better Intra-abdominal coverage  Plan: Discontinue Unasyn Start Zosyn 3.375g IV q8h (4 hour infusion). Monitor clinical progress, cultures/sensitivities, renal function, abx plan   Height: 5\' 6"  (167.6 cm) Weight: 83 kg (183 lb) IBW/kg (Calculated) : 63.8  Temp (24hrs), Avg:98.7 F (37.1 C), Min:97.4 F (36.3 C), Max:99.8 F (37.7 C)  Recent Labs  Lab 11/28/2021 0756 11/18/21 0315 11/18/21 1753 11/14/2021 0337  WBC 10.8* 17.0* 16.9* 14.4*  CREATININE 1.16 1.20  --  1.29*    Estimated Creatinine Clearance: 54.7 mL/min (A) (by C-G formula based on SCr of 1.29 mg/dL (H)).    No Known Allergies  Antimicrobials this admission: 6/17 Ancef >> 6/18 6/19 Unasyn x 1 6/19 Zosyn >>  Dose adjustments this admission:   Thank you for allowing 7/19 to participate in this patients care. [place], PharmD 11/12/2021 5:50 PM  **Pharmacist phone directory can be found on [email or web address] listed under [facility]**

## 2021-11-19 NOTE — Progress Notes (Signed)
Patient ID: Stephen Whitney, male   DOB: 17-Nov-1950, 71 y.o.   MRN: [id number] BP 113/68   Pulse 82   Temp (!) 97.4 F (36.3 C) (Oral)   Resp (!) 22   Ht 5\' 6"  (1.676 m)   Wt 83 kg   SpO2 93%   BMI 29.54 kg/m  Patient is currently in the OR.

## 2021-11-19 NOTE — Anesthesia Procedure Notes (Signed)
Procedure Name: Intubation Date/Time: 11/21/2021 6:36 PM  Performed by: Claris Che, CRNAPre-anesthesia Checklist: Patient identified, Emergency Drugs available, Suction available, Patient being monitored and Timeout performed Patient Re-evaluated:Patient Re-evaluated prior to induction Oxygen Delivery Method: Circle system utilized Preoxygenation: Pre-oxygenation with 100% oxygen Induction Type: IV induction and Cricoid Pressure applied Ventilation: Mask ventilation without difficulty Laryngoscope Size: Mac and 4 Grade View: Grade III Tube type: Oral Tube size: 8.0 mm Airway Equipment and Method: Stylet Placement Confirmation: ETT inserted through vocal cords under direct vision, positive ETCO2 and breath sounds checked- equal and bilateral Secured at: 24 cm Tube secured with: Tape Dental Injury: Teeth and Oropharynx as per pre-operative assessment

## 2021-11-19 NOTE — Progress Notes (Signed)
eLink Physician-Brief Progress Note Patient Name: Stephen Whitney DOB: December 06, 1950 MRN: [id number]   Date of Service  11/16/2021  HPI/Events of Note  71 year old man with HFrEF, hypertension, COPD, presented with paraplegia, urinary retention and decreased sensations and was found to have acute C5-6 cord compression due to large herniated nucleus pulposus.  He underwent ACDF on 6/17.  Today found to free air in abd in chest imaging.  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  Remains intubated post-op.  Presently hemodynamically stable.  No acute needs presently.  eICU Interventions  Chart reviewed     Intervention Category Evaluation Type: New Patient Evaluation  Henry Russel, P 11/03/2021, 10:14 PM

## 2021-11-20 ENCOUNTER — Inpatient Hospital Stay (HOSPITAL_COMMUNITY): Payer: Medicare HMO

## 2021-11-20 ENCOUNTER — Encounter (HOSPITAL_COMMUNITY): Payer: Self-pay | Admitting: Surgery

## 2021-11-20 DIAGNOSIS — J9602 Acute respiratory failure with hypercapnia: Secondary | ICD-10-CM | POA: Diagnosis not present

## 2021-11-20 DIAGNOSIS — K631 Perforation of intestine (nontraumatic): Secondary | ICD-10-CM

## 2021-11-20 DIAGNOSIS — J9601 Acute respiratory failure with hypoxia: Secondary | ICD-10-CM | POA: Diagnosis not present

## 2021-11-20 DIAGNOSIS — G9520 Unspecified cord compression: Secondary | ICD-10-CM | POA: Diagnosis not present

## 2021-11-20 LAB — GLUCOSE, CAPILLARY
Glucose-Capillary: 136 mg/dL — ABNORMAL HIGH (ref 70–99)
Glucose-Capillary: 137 mg/dL — ABNORMAL HIGH (ref 70–99)
Glucose-Capillary: 140 mg/dL — ABNORMAL HIGH (ref 70–99)
Glucose-Capillary: 146 mg/dL — ABNORMAL HIGH (ref 70–99)
Glucose-Capillary: 161 mg/dL — ABNORMAL HIGH (ref 70–99)
Glucose-Capillary: 196 mg/dL — ABNORMAL HIGH (ref 70–99)
Glucose-Capillary: 200 mg/dL — ABNORMAL HIGH (ref 70–99)

## 2021-11-20 LAB — POCT I-STAT 7, (LYTES, BLD GAS, ICA,H+H)
Acid-Base Excess: 1 mmol/L (ref 0.0–2.0)
Bicarbonate: 24.7 mmol/L (ref 20.0–28.0)
Calcium, Ion: 1.22 mmol/L (ref 1.15–1.40)
HCT: 25 % — ABNORMAL LOW (ref 39.0–52.0)
Hemoglobin: 8.5 g/dL — ABNORMAL LOW (ref 13.0–17.0)
O2 Saturation: 89 %
Patient temperature: 36.6
Potassium: 4.2 mmol/L (ref 3.5–5.1)
Sodium: 138 mmol/L (ref 135–145)
TCO2: 26 mmol/L (ref 22–32)
pCO2 arterial: 34.7 mmHg (ref 32–48)
pH, Arterial: 7.459 — ABNORMAL HIGH (ref 7.35–7.45)
pO2, Arterial: 51 mmHg — ABNORMAL LOW (ref 83–108)

## 2021-11-20 LAB — COMPREHENSIVE METABOLIC PANEL
ALT: 17 U/L (ref 0–44)
AST: 38 U/L (ref 15–41)
Albumin: 2.5 g/dL — ABNORMAL LOW (ref 3.5–5.0)
Alkaline Phosphatase: 18 U/L — ABNORMAL LOW (ref 38–126)
Anion gap: 5 (ref 5–15)
BUN: 23 mg/dL (ref 8–23)
CO2: 24 mmol/L (ref 22–32)
Calcium: 8.1 mg/dL — ABNORMAL LOW (ref 8.9–10.3)
Chloride: 107 mmol/L (ref 98–111)
Creatinine, Ser: 1.11 mg/dL (ref 0.61–1.24)
GFR, Estimated: >60 mL/min (ref >60)
Glucose, Bld: 197 mg/dL — ABNORMAL HIGH (ref 70–99)
Potassium: 4.3 mmol/L (ref 3.5–5.1)
Sodium: 136 mmol/L (ref 135–145)
Total Bilirubin: 0.9 mg/dL (ref 0.3–1.2)
Total Protein: 4.5 g/dL — ABNORMAL LOW (ref 6.5–8.1)

## 2021-11-20 LAB — CBC
HCT: 26.3 % — ABNORMAL LOW (ref 39.0–52.0)
Hemoglobin: 9.1 g/dL — ABNORMAL LOW (ref 13.0–17.0)
MCH: 31.9 pg (ref 26.0–34.0)
MCHC: 34.6 g/dL (ref 30.0–36.0)
MCV: 92.3 fL (ref 80.0–100.0)
Platelets: 99 10*3/uL — ABNORMAL LOW (ref 150–400)
RBC: 2.85 MIL/uL — ABNORMAL LOW (ref 4.22–5.81)
RDW: 12.1 % (ref 11.5–15.5)
WBC: 8.4 10*3/uL (ref 4.0–10.5)
nRBC: 0 % (ref 0.0–0.2)

## 2021-11-20 LAB — HEMOGLOBIN A1C
Hgb A1c MFr Bld: 5.4 % (ref 4.8–5.6)
Mean Plasma Glucose: 108.28 mg/dL

## 2021-11-20 LAB — TRIGLYCERIDES: Triglycerides: 69 mg/dL (ref <150)

## 2021-11-20 MED ORDER — POLYETHYLENE GLYCOL 3350 17 G PO PACK: 17.0000 g | PACK | Freq: Every day | ORAL | Status: DC

## 2021-11-20 MED ORDER — ACETAMINOPHEN 10 MG/ML IV SOLN
1000.0000 mg | Freq: Four times a day (QID) | INTRAVENOUS | Status: AC | PRN
  Administered 2021-11-20 – 2021-11-21 (×3): 1000 mg via INTRAVENOUS
  Filled 2021-11-20 (×3): qty 100

## 2021-11-20 MED ORDER — POLYETHYLENE GLYCOL 3350 17 G PO PACK: 17.0000 g | PACK | Freq: Every day | ORAL | Status: DC | PRN

## 2021-11-20 MED ORDER — IPRATROPIUM-ALBUTEROL 0.5-2.5 (3) MG/3ML IN SOLN
3.0000 mL | RESPIRATORY_TRACT | Status: DC | PRN
Start: 2021-11-20 — End: 2021-11-21

## 2021-11-20 MED ORDER — ORAL CARE MOUTH RINSE
15.0000 mL | OROMUCOSAL | Status: DC | PRN
  Administered 2021-11-21: 15 mL via OROMUCOSAL

## 2021-11-20 MED ORDER — CIPROFLOXACIN IN D5W 400 MG/200ML IV SOLN
400.0000 mg | Freq: Two times a day (BID) | INTRAVENOUS | Status: AC
  Administered 2021-11-20 – 2021-11-24 (×10): 400 mg via INTRAVENOUS
  Filled 2021-11-20 (×10): qty 200

## 2021-11-20 MED ORDER — DOCUSATE SODIUM 50 MG/5ML PO LIQD: 100.0000 mg | Freq: Two times a day (BID) | ORAL | Status: DC

## 2021-11-20 MED ORDER — METRONIDAZOLE 500 MG/100ML IV SOLN
500.0000 mg | Freq: Two times a day (BID) | INTRAVENOUS | Status: AC
  Administered 2021-11-20 – 2021-11-24 (×10): 500 mg via INTRAVENOUS
  Filled 2021-11-20 (×10): qty 100

## 2021-11-20 MED ORDER — FUROSEMIDE 10 MG/ML IJ SOLN
INTRAMUSCULAR | Status: AC
  Filled 2021-11-20: qty 4

## 2021-11-20 MED ORDER — INSULIN ASPART 100 UNIT/ML IJ SOLN
0.0000 [IU] | INTRAMUSCULAR | Status: AC
  Administered 2021-11-20: 2 [IU] via SUBCUTANEOUS
  Administered 2021-11-20: 1 [IU] via SUBCUTANEOUS
  Administered 2021-11-20: 2 [IU] via SUBCUTANEOUS
  Administered 2021-11-20: 1 [IU] via SUBCUTANEOUS
  Administered 2021-11-21: 2 [IU] via SUBCUTANEOUS
  Administered 2021-11-21: 1 [IU] via SUBCUTANEOUS
  Administered 2021-11-22 (×3): 2 [IU] via SUBCUTANEOUS
  Administered 2021-11-22 – 2021-11-23 (×3): 1 [IU] via SUBCUTANEOUS
  Administered 2021-11-23 (×4): 2 [IU] via SUBCUTANEOUS

## 2021-11-20 MED ORDER — CIPROFLOXACIN IN D5W 400 MG/200ML IV SOLN
400.0000 mg | Freq: Two times a day (BID) | INTRAVENOUS | Status: DC
  Filled 2021-11-20: qty 200

## 2021-11-20 MED ORDER — AMIODARONE IV BOLUS ONLY 150 MG/100ML
150.0000 mg | Freq: Once | INTRAVENOUS | Status: AC
  Administered 2021-11-20: 150 mg via INTRAVENOUS
  Filled 2021-11-20: qty 100

## 2021-11-20 MED ORDER — FENTANYL CITRATE (PF) 100 MCG/2ML IJ SOLN: 25.0000 ug | Freq: Once | INTRAMUSCULAR | Status: AC

## 2021-11-20 MED ORDER — FENTANYL 2500MCG IN NS 250ML (10MCG/ML) PREMIX INFUSION
25.0000 ug/h | INTRAVENOUS | Status: DC
  Administered 2021-11-20: 25 ug/h via INTRAVENOUS
  Filled 2021-11-20: qty 250

## 2021-11-20 MED ORDER — METOPROLOL TARTRATE 5 MG/5ML IV SOLN
5.0000 mg | Freq: Once | INTRAVENOUS | Status: DC
Start: 2021-11-20 — End: 2021-11-21

## 2021-11-20 MED ORDER — FENTANYL BOLUS VIA INFUSION
25.0000 ug | INTRAVENOUS | Status: DC | PRN
  Administered 2021-11-20: 25 ug via INTRAVENOUS

## 2021-11-20 MED ORDER — FUROSEMIDE 10 MG/ML IJ SOLN
40.0000 mg | Freq: Once | INTRAMUSCULAR | Status: AC
  Administered 2021-11-20: 40 mg via INTRAVENOUS

## 2021-11-20 MED ORDER — METRONIDAZOLE 500 MG/100ML IV SOLN: 500.0000 mg | Freq: Two times a day (BID) | INTRAVENOUS | Status: DC

## 2021-11-20 NOTE — Progress Notes (Signed)
Inpatient Rehab Admissions Coordinator:    Pt. Is on vent, not currently appropriate for CIR. Will continue to follow from a distance.   Megan Salon, MS, CCC-SLP Rehab Admissions Coordinator  [phone] (celll) [phone] (office)

## 2021-11-20 NOTE — Progress Notes (Signed)
Pt began to desat while on 3L N/C. Increased WOB with abdominal muscle use shown. RT notified. Pt placed on NRB. Dr. Delton Coombes MD notified of increased HR and WOB and Chand MD responded at bedside. Orders for amiodarone bolus and lasix received.

## 2021-11-20 NOTE — Progress Notes (Signed)
PCCM interval note:  Patient was noted to be tachypneic and hypoxic.  I evaluated him at bedside, he is alert, awake, complaining of shortness of breath, he was placed on nonrebreather mask with improvement in O2 sat into the mid 90s He was noted to be in A-fib with RVR, his blood pressure with map of 67, he was given 150 mg bolus of amiodarone Also he has bilateral basal crackles, 40 mg of IV Lasix given  We will closely monitor the progress    Cheri Fowler, MD [facility] See Amion for pager If no response to pager, please call [phone] until 7pm After 7pm, Please call E-link [phone]

## 2021-11-20 NOTE — Progress Notes (Signed)
Patient ID: Stephen Whitney, male   DOB: 1950/07/26, 71 y.o.   MRN: [id number] BP (!) 86/59   Pulse (!) 118   Temp 99.1 F (37.3 C) (Axillary)   Resp 19   Ht 5\' 6"  (1.676 m)   Wt 83 kg   SpO2 95%   BMI 29.54 kg/m  Events of yesterday noted.  Neurologically 2/5 in biceps, triceps, deltoids No movement to command in lower extremities Wound is clean, dry, and without signs of infection.  No new recommendations

## 2021-11-20 NOTE — Progress Notes (Addendum)
NAME:  Stephen Whitney, MRN:  [id number], DOB:  Jan 24, 1951, LOS: 3 ADMISSION DATE:  11/07/2021, CONSULTATION DATE:  11/20/21 REFERRING MD: Freida Busman , CHIEF COMPLAINT:  Perforated sigmoid diverticulitis   History of Present Illness:  Stephen Whitney is a 71 y.o. M with PMH significant for Atrial fibrillation, CAD and MI, CHF, COPD, CVA who presented 6/17 with numbness, tingling and weakness in his arms and leg and pain of the back and neck.  He was diagnosed with C5-C6 compression and required emergent decompression.  On 6/19, pt developed acute respiratory distress requiring non-rebreather.  CXR was concerning for free air and CT abd/pelvis concerning for bowel perforation.  Surgery was consulted and pt taken to the OR for emergent ex-lap which revealed perforated sigmoid diverticulitis.  He was left intubated post-op and transferred to the ICU.  Pertinent  Medical History   has a past medical history of A-fib (HCC), CHF (congestive heart failure) (HCC), COPD (chronic obstructive pulmonary disease) (HCC), Dupuytren's contracture of both hands, Dysrhythmia, GAD (generalized anxiety disorder), Hepatitis C, History of atrial fibrillation (11/01/2021), History of CVA (cerebrovascular accident) (11/13/2021), History of MI (myocardial infarction) (11/27/2021), Hypertension, Osteoarthritis, and Stroke (HCC).   Significant Hospital Events: Including procedures, antibiotic start and stop dates in addition to other pertinent events   6/17 admitted with C5-C6 compression requiring emergent decompression 6/19 acute respiratory distress, free air on CT abd/pelvis, found to have perforated sigmoid diverticulitis > emergent OR 6/19 6/19 rash in the OR with Zosyn, had also received Unasyn.  Changed to Cipro/Flagyl on 6/20  Interim History / Subjective:  1.00, PEEP 10 Norepinephrine 4, phenylephrine off Propofol 30 I/O. +23 cc total, UOP 745 cc last 24 hours Platelets 99 S Cr 1.29 > 1.11   Objective   Blood  pressure 108/72, pulse 86, temperature 98.1 F (36.7 C), temperature source Oral, resp. rate 20, height 5\' 6"  (1.676 m), weight 83 kg, SpO2 100 %.    Vent Mode: PRVC FiO2 (%):  [100 %] 100 % Set Rate:  [24 bmp-28 bmp] 24 bmp Vt Set:  [510 mL] 510 mL PEEP:  [10 cmH20] 10 cmH20 Plateau Pressure:  [11 cmH20-24 cmH20] 11 cmH20   Intake/Output Summary (Last 24 hours) at 11/20/2021 0740 Last data filed at 11/20/2021 0615 Gross per 24 hour  Intake 3000.18 ml  Output 945 ml  Net 2055.18 ml   Filed Weights   11/11/2021 1302 11/01/2021 1310  Weight: 83 kg 83 kg   General: Critically ill, sedated, ventilated HEENT: ET tube in place, pupils 1 to 2 mm, rash fading on face Neuro: Sedated, RASS -3 CV: Regular, distant, no murmur PULM: Decreased to both bases, no wheezes or crackles. GI: Soft, ostomy in place, hypoactive bowel sounds Extremities: No edema Skin: Macular erythematous rash trunk and groin   Chest x-ray 6/20 reviewed by me shows elevated right hemidiaphragm with associated atelectasis versus infiltrate, mild vascular prominence  Resolved Hospital Problem list     Assessment & Plan:   Acute Hypoxic and Hypercarbic Respiratory Failure secondary to likely aspiration Pneumonia and underlying COPD -PRVC.  In absence of diffuse infiltrates continue 8 cc/kg -Wean PEEP, FiO2 as able -Ensure adequate sedation-add continuous fentanyl to PAD protocol, propofol -Follow chest x-ray, ABG -VAP prevention orders -Pulmonary hygiene  Shock, likely septic shock + possible contribution sedating meds Pneumoperitoneum secondary to perforated Sigmoid Diverticula with presumed peritonitis  -Post emergent ex lap and ostomy creation for sigmoid perforation and peritonitis -Appreciate general surgery management -N.p.o. -Given rash in the  OR with Zosyn plan change antibiotics 6/22 Cipro/Flagyl -Follow lactic acid for clearance -Follow blood cultures -Wean norepinephrine as able  Erythematous  Rash Most likely allergic, unclear etiology, on trunk, groin and face, sparing extremities -continue Benadryl 50mg  and continue Solumedrol x 2 more days -changed abx as above on 6/20  Recent C5-C6 Compression with recent emergent decompression -Appreciate management by neurosurgery.  Will discuss utility, duration of Solu-Medrol post decompression  AKI S Cr 1.16 > 1.29 > 1.11 -Decrease maintenance IV fluids 6/20, bolus as indicated -Follow urine output, BMP -Ensure adequate renal perfusion, renal dose medications  History of Atrial Fibrillation and Chronic HFpEF -Continue to hold Eliquis, home medications -Restart home regimen as he stabilizes  Hyperglycemia, contribution steroids -Initiate CBG, sliding scale insulin coverage  Nutrition -N.p.o. for now, try trickle TF when okay with general surgery   Best Practice (right click and "Reselect all SmartList Selections" daily)   Diet/type: NPO DVT prophylaxis: SCD GI prophylaxis: PPI Lines: N/A Foley:  Yes, and it is still needed Code Status:  full code Last date of multidisciplinary goals of care discussion [pending]  Labs   CBC: Recent Labs  Lab 11/11/2021 0756 11/18/21 0315 11/18/21 1753 11/29/2021 0337 11/30/2021 1904 11/24/2021 1954 11/27/2021 2142 11/20/21 0514  WBC 10.8* 17.0* 16.9* 14.4*  --   --   --  8.4  NEUTROABS 8.1*  --   --   --   --   --   --   --   HGB 11.7* 12.3* 11.1* 10.8* 9.9* 10.9* 9.2* 9.1*  HCT 33.1* 34.3* 30.8* 31.2* 29.0* 32.0* 27.0* 26.3*  MCV 91.4 90.0 90.9 93.4  --   --   --  92.3  PLT 144* 149* 124* 116*  --   --   --  99*    Basic Metabolic Panel: Recent Labs  Lab 11/12/2021 0756 11/18/21 0315 11/01/2021 0337 11/25/2021 1904 11/25/2021 1954 11/07/2021 2142 11/20/21 0514  NA 135 135 133* 135 136 136 136  K 3.5 4.0 4.4 5.0 4.9 4.9 4.3  CL 105 100 104  --   --   --  107  CO2 23 24 21*  --   --   --  24  GLUCOSE 102* 169* 130*  --   --   --  197*  BUN 11 17 33*  --   --   --  23  CREATININE  1.16 1.20 1.29*  --   --   --  1.11  CALCIUM 7.7* 8.8* 8.5*  --   --   --  8.1*   GFR: Estimated Creatinine Clearance: 63.5 mL/min (by C-G formula based on SCr of 1.11 mg/dL). Recent Labs  Lab 11/18/21 0315 11/18/21 1753 11/11/2021 0337 11/09/2021 1649 11/20/21 0514  WBC 17.0* 16.9* 14.4*  --  8.4  LATICACIDVEN  --   --   --  2.3*  --     Liver Function Tests: Recent Labs  Lab 11/20/2021 0756 11/18/21 0315 11/10/2021 0337 11/20/21 0514  AST 14* 19 25 38  ALT 15 19 15 17   ALKPHOS 30* 33* 26* 18*  BILITOT 0.5 1.1 1.1 0.9  PROT 4.4* 5.3* 4.9* 4.5*  ALBUMIN 2.8* 3.1* 2.8* 2.5*   No results for input(s): "LIPASE", "AMYLASE" in the last 168 hours. No results for input(s): "AMMONIA" in the last 168 hours.  ABG    Component Value Date/Time   PHART 7.294 (L) 11/16/2021 2142   PCO2ART 52.5 (H) 11/02/2021 2142   PO2ART 80 (L) 11/02/2021  2142   HCO3 25.5 11/21/2021 2142   TCO2 27 11/23/2021 2142   ACIDBASEDEF 1.0 11/07/2021 2142   O2SAT 94 11/02/2021 2142     Coagulation Profile: Recent Labs  Lab 11/20/2021 1649  INR 1.2    Cardiac Enzymes: No results for input(s): "CKTOTAL", "CKMB", "CKMBINDEX", "TROPONINI" in the last 168 hours.  HbA1C: No results found for: "HGBA1C"  CBG: Recent Labs  Lab 11/11/2021 2036 11/13/2021 2351 11/20/21 0340 11/20/21 0722  GLUCAP 136* 157* 200* 196*     Critical care time: 35 minutes    CRITICAL CARE Performed by: Leslye Peer   Total critical care time:  35 minutes  Critical care time was exclusive of separately billable procedures and treating other patients.  Critical care was necessary to treat or prevent imminent or life-threatening deterioration.  Critical care was time spent personally by me on the following activities: development of treatment plan with patient and/or surrogate as well as nursing, discussions with consultants, evaluation of patient's response to treatment, examination of patient, obtaining history from  patient or surrogate, ordering and performing treatments and interventions, ordering and review of laboratory studies, ordering and review of radiographic studies, pulse oximetry and re-evaluation of patient's condition.   Levy Pupa, MD, PhD 11/20/2021, 7:40 AM [facility] [phone] or if no answer before 7:00PM call [phone] For any issues after 7:00PM please call eLink [phone]

## 2021-11-20 NOTE — Consult Note (Signed)
WOC Nurse ostomy consult note Pt had ostomy surgery performed yesterday. He is currently critically ill in ICU on the ventilator.  WOC team will begin teaching sessions when Pt is stable and out of ICU.  Stoma type/location: Stoma is red and viable when visualized through the pouch, which is intact with good seal.  No stool or flatus, small amt pink drainage.  Educational materials and one set of pouching supplies left at the bedside.  WOC team will perform first post-op pouch change tomorrow.  No family present.  Enrolled patient in [employer] DC program: Not yet. Cammie Mcgee MSN, RN, CWOCN, [place], CNS [phone]

## 2021-11-20 NOTE — Progress Notes (Signed)
RN called RT stating that pt's SpO2 has decreased to 80's while on 6L [place]. RT came to bedside to find pt on 15L [place] pt has increased WOB, tachycardic in 140's. RT placed NRB 15L on pt. MD notified of pt's condition.

## 2021-11-20 NOTE — Progress Notes (Signed)
PCCM Interval Note  I spoke with the [employer] regarding the patient's status to help facilitate getting son leave from military to be present here. They are working on expediting this.  [id number], case # [id number].    Levy Pupa, MD, PhD 11/20/2021, 10:47 AM [facility] [phone] or if no answer before 7:00PM call [phone] For any issues after 7:00PM please call eLink [phone]

## 2021-11-20 NOTE — Progress Notes (Signed)
Chand MD aware of Pts belly breathing on NRB after amio and lasix. VS as follows afib at 116, O2 at 96%, BP 116/57, RR 23

## 2021-11-20 NOTE — Progress Notes (Signed)
[facility] Progress Note  1 Day Post-Op  Subjective: CC:   Intubated, sedated. On a small amt levo.    Objective: Vital signs in last 24 hours: Temp:  [97.4 F (36.3 C)-98.4 F (36.9 C)] 98.1 F (36.7 C) (06/20 0724) Pulse Rate:  [80-96] 86 (06/20 0540) Resp:  [16-27] 20 (06/20 0540) BP: (94-124)/(63-84) 108/72 (06/20 0331) SpO2:  [81 %-100 %] 100 % (06/20 0540) Arterial Line BP: (81-175)/(47-76) 175/76 (06/20 0540) FiO2 (%):  [100 %] 100 % (06/20 0331) Last BM Date : 11/16/21  Intake/Output from previous day: 06/19 0701 - 06/20 0700 In: 3000.2 [I.V.:2118.8; IV Piggyback:881.4] Out: 945 [Urine:745; Emesis/NG output:150; Blood:50] Intake/Output this shift: No intake/output data recorded.  PE: Gen:  ill appearing white male, intubated and sedated  Card:  Regular rate and rhythm on monitor Pulm:  ventilated respirations, on 100%, 10 of PEEP Abd: Soft, protuberant. Midline incision loosely closed with staples, scant SS oozing. Ostomy viable with some SS drainage in ostomy pouch, no gas.  Skin: warm and dry, no rashes -- per RN had a rash on his abdomen that resolved with benadryl thought to be 2/2 Zosyn. Psych: unable to assess.  Lab Results:  Recent Labs    11/08/2021 0337 11/12/2021 1904 11/21/2021 2142 11/20/21 0514  WBC 14.4*  --   --  8.4  HGB 10.8*   < > 9.2* 9.1*  HCT 31.2*   < > 27.0* 26.3*  PLT 116*  --   --  99*   < > = values in this interval not displayed.   BMET Recent Labs    11/22/2021 0337 11/18/2021 1904 11/22/2021 2142 11/20/21 0514  NA 133*   < > 136 136  K 4.4   < > 4.9 4.3  CL 104  --   --  107  CO2 21*  --   --  24  GLUCOSE 130*  --   --  197*  BUN 33*  --   --  23  CREATININE 1.29*  --   --  1.11  CALCIUM 8.5*  --   --  8.1*   < > = values in this interval not displayed.   PT/INR Recent Labs    11/24/2021 1649  LABPROT 15.3*  INR 1.2   CMP     Component Value Date/Time   NA 136 11/20/2021 0514   K 4.3 11/20/2021 0514    CL 107 11/20/2021 0514   CO2 24 11/20/2021 0514   GLUCOSE 197 (H) 11/20/2021 0514   BUN 23 11/20/2021 0514   CREATININE 1.11 11/20/2021 0514   CALCIUM 8.1 (L) 11/20/2021 0514   PROT 4.5 (L) 11/20/2021 0514   ALBUMIN 2.5 (L) 11/20/2021 0514   AST 38 11/20/2021 0514   ALT 17 11/20/2021 0514   ALKPHOS 18 (L) 11/20/2021 0514   BILITOT 0.9 11/20/2021 0514   GFRNONAA >60 11/20/2021 0514   Lipase  No results found for: "LIPASE"     Studies/Results: DG CHEST PORT 1 VIEW  Result Date: 11/18/2021 CLINICAL DATA:  Acute respiratory failure with hypoxemia. EXAM: PORTABLE CHEST 1 VIEW COMPARISON:  November 19, 2021 (10:29 a.m.) FINDINGS: Since the prior study, there is been interval placement of an endotracheal tube. Its distal tip sits approximally 5.0 cm from the carina. Interval nasogastric tube placement is also seen with its distal tip overlying the expected region of the gastroesophageal junction. There is stable moderate severity right basilar and mild left basilar atelectasis and/or infiltrate. A small, stable right  pleural effusion is noted. The heart size and mediastinal contours are within normal limits. There is mild calcification of the aortic arch. The intra-abdominal free air seen on the prior study is not clearly visualized on the current exam. The visualized skeletal structures are unremarkable. IMPRESSION: 1. Interval endotracheal tube and nasogastric tube placement positioning, as described above. Further advancement of the NG tube by approximately 7 cm is recommended to decrease the risk of aspiration. 2. Stable moderate severity right basilar and mild left basilar atelectasis and/or infiltrate. 3. Stable small right pleural effusion. Electronically Signed   By: Virgina Norfolk M.D.   On: 11/10/2021 21:54   CT CHEST ABDOMEN PELVIS W CONTRAST  Result Date: 11/18/2021 CLINICAL DATA:  Peritonitis or perforation suspected EXAM: CT CHEST, ABDOMEN, AND PELVIS WITH CONTRAST TECHNIQUE:  Multidetector CT imaging of the chest, abdomen and pelvis was performed following the standard protocol during bolus administration of intravenous contrast. RADIATION DOSE REDUCTION: This exam was performed according to the departmental dose-optimization program which includes automated exposure control, adjustment of the mA and/or kV according to patient size and/or use of iterative reconstruction technique. CONTRAST:  120mL OMNIPAQUE IOHEXOL 300 MG/ML  SOLN COMPARISON:  None Available. FINDINGS: CT CHEST FINDINGS Cardiovascular: Aortic atherosclerosis. Normal heart size. Left coronary artery calcifications. No pericardial effusion. Mediastinum/Nodes: No enlarged mediastinal, hilar, or axillary lymph nodes. Thyroid gland, trachea, and esophagus demonstrate no significant findings. Lungs/Pleura: Examination of the lungs is limited by breath motion artifact within this limitation, complete consolidation or atelectasis of the right lower and right middle lobes with air bronchograms and a small associated pleural effusion. Trace left pleural effusion. Musculoskeletal: No chest wall mass or suspicious osseous lesions identified. CT ABDOMEN PELVIS FINDINGS Hepatobiliary: No solid liver abnormality is seen. No gallstones, gallbladder wall thickening, or biliary dilatation. Pancreas: Unremarkable. No pancreatic ductal dilatation or surrounding inflammatory changes. Spleen: Normal in size without significant abnormality. Adrenals/Urinary Tract: Adrenal glands are unremarkable. Severely atrophic right kidney. The left kidney is normal, without renal calculi, solid lesion, or hydronephrosis. Foley catheter in the urinary bladder. Stomach/Bowel: Stomach is within normal limits. Appendix is normal. Extraluminal air locules in the small bowel mesentery adjacent to a decompressed, diverticular appearing loop of distal small bowel in the central abdomen (series 3, image 101, series 7, image 50). Descending and sigmoid  diverticulosis. Vascular/Lymphatic: No significant vascular findings are present. No enlarged abdominal or pelvic lymph nodes. Reproductive: No mass or other abnormality. Other: Fat containing umbilical hernia. Anasarca. Small volume pneumoperitoneum. Musculoskeletal: No acute osseous findings. IMPRESSION: 1. Small volume pneumoperitoneum. Extraluminal air locules in the small bowel mesentery adjacent to a decompressed, diverticular appearing loop of distal small bowel in the central abdomen, suggesting nidus of bowel perforation. No distended bowel loops or evidence of obstruction. 2. Examination of the lungs is limited by breath motion artifact. Within this limitation, complete consolidation or atelectasis of the right lower and right middle lobes with air bronchograms and a small associated pleural effusion. Findings suggest infection or aspiration. 3. Coronary artery disease. Aortic Atherosclerosis (ICD10-I70.0). Electronically Signed   By: Delanna Ahmadi M.D.   On: 11/15/2021 15:41   DG Chest 2 View  Addendum Date: 11/06/2021   ADDENDUM REPORT: 11/12/2021 11:06 ADDENDUM: Critical Value/emergent results were called by telephone at the time of interpretation on 11/09/2021 at 11:06 am to provider DR. DEAN, who verbally acknowledged these results. Electronically Signed   By: Ilona Sorrel M.D.   On: 11/11/2021 11:06   Result Date: 11/16/2021 CLINICAL DATA:  Dyspnea EXAM: CHEST - 2 VIEW COMPARISON:  2021-11-21 chest radiograph. FINDINGS: Motion degraded lateral view. Stable cardiomediastinal silhouette with normal heart size. No pneumothorax. Volume loss in the right hemithorax with rightward mediastinal shift, slightly worsened. Patchy right lung base opacity is slightly increased. Minimal left basilar atelectasis is stable. No pulmonary edema. Apparent free air under the right hemidiaphragm. Possible trace left pleural effusion. IMPRESSION: 1. Apparent free air under the right hemidiaphragm, cannot exclude  bowel perforation. CT abdomen/pelvis with oral and IV contrast recommended for further evaluation. 2. Volume loss in the right hemithorax with patchy right lung base opacity, mildly worsened., likely atelectasis, cannot exclude a component of aspiration or pneumonia. 3. Possible trace left pleural effusion. Electronically Signed: By: Delbert Phenix M.D. On: 2021-11-21 10:55   DG CHEST PORT 1 VIEW  Addendum Date: 2021-11-21   ADDENDUM REPORT: 2021-11-21 09:11 ADDENDUM: Relative lucency along the right diaphragm adjacent to the area of airspace disease likely reflecting partially aerated lung, but if there is concern for pneumoperitoneum recommend an abdominal series for further evaluation. These results were called by telephone at the time of interpretation on 11/21/21 at 9:11 am to provider Reymundo Poll , who verbally acknowledged these results. Electronically Signed   By: Elige Ko M.D.   On: 2021/11/21 09:11   Result Date: 21-Nov-2021 CLINICAL DATA:  Dyspnea EXAM: PORTABLE CHEST 1 VIEW COMPARISON:  None Available. FINDINGS: Right lower lobe airspace disease concerning for pneumonia. No pleural effusion or pneumothorax. Heart and mediastinal contours are unremarkable. No acute osseous abnormality. IMPRESSION: 1. Right lower lobe airspace disease concerning for pneumonia. Electronically Signed: By: Elige Ko M.D. On: 11-21-2021 08:54    Anti-infectives: Anti-infectives (From admission, onward)    Start     Dose/Rate Route Frequency Ordered Stop   Nov 21, 2021 1745  piperacillin-tazobactam (ZOSYN) IVPB 3.375 g        3.375 g 12.5 mL/hr over 240 Minutes Intravenous Every 8 hours 11/21/2021 1651     Nov 21, 2021 1015  ampicillin-sulbactam (UNASYN) 1.5 g in sodium chloride 0.9 % 100 mL IVPB  Status:  Discontinued        1.5 g 200 mL/hr over 30 Minutes Intravenous Every 6 hours Nov 21, 2021 0918 Nov 21, 2021 1651   11/25/2021 2030  ceFAZolin (ANCEF) IVPB 2g/100 mL premix        2 g 200 mL/hr over 30 Minutes  Intravenous Every 8 hours 11/12/2021 1944 11/18/21 0507   11/11/2021 1312  ceFAZolin (ANCEF) 2-4 GM/100ML-% IVPB       Note to Pharmacy: Crissie Sickles: cabinet override      11/06/2021 1312 11/18/21 0129        Assessment/Plan Pneumoperitoneum Acute sigmoid diverticulitis with perforation  S/P  Exploratory laparotomy, sigmoid resection, end sigmoid colostomy, primary repair of small bowel serosal injury 2021-11-21 Dr. Sophronia Simas - POD#1, ongoing sepsis related to both aspiration PNA and diverticulitis. Mgmt of sepsis per CCM  - continue OG to LIWS today and await bowel function; would not start TF today. - WOC RN consulted for new ostomy - daily dry dressing changes to midline wound and as needed for saturation   FEN: NPO, IVF, OG to LIWS  ID: Zosyn 6/19 >> VTE: SCD's, currently not on chemical VTE, afib currently rate controlled and platelets < 100K would continue hold heparin gtt for today. Foley: in place for strict I&Os, and may need to stay in place for neurogenic bladder. 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 Danielle Dess AKI  Afib on Eliquis at home (held) Heart Failure with preserved EF   LOS: 3 days   I reviewed nursing notes, Consultant CCM, NS notes, last 24 h vitals and pain scores, last 48 h intake and output, last 24 h labs and trends, and last 24 h imaging results.   Hosie Spangle, PA-C [facility] Please see Amion for pager number during day hours 7:00am-4:30pm

## 2021-11-20 NOTE — Anesthesia Postprocedure Evaluation (Signed)
Anesthesia Post Note  Patient: Stephen Whitney Word  Procedure(s) Performed: EXPLORATORY LAPAROTOMY SIGMOID COLECTOMY COLOSTOMY     Patient location during evaluation: SICU Anesthesia Type: General Level of consciousness: sedated Pain management: pain level controlled Vital Signs Assessment: post-procedure vital signs reviewed and stable Respiratory status: patient remains intubated per anesthesia plan Cardiovascular status: stable Postop Assessment: no apparent nausea or vomiting Anesthetic complications: no   No notable events documented.  Last Vitals:  Vitals:   11/20/21 0105 11/20/21 0114  BP:  124/75  Pulse: 81 83  Resp: (!) 26 (!) 26  Temp:    SpO2: 100% 100%    Last Pain:  Vitals:   11/25/2021 2352  TempSrc: Oral  PainSc:                  Shelton Silvas

## 2021-11-20 NOTE — Progress Notes (Signed)
PT Cancellation Note  Patient Details Name: Stephen Whitney MRN: [id number] DOB: 1951-05-29   Cancelled Treatment:    Reason Eval/Treat Not Completed: Patient not medically ready.  RN asks to hold until tomorrow. 11/20/2021  Jacinto Halim., PT [facility] [phone]  (pager) [phone]  (office)   Stephen Whitney 11/20/2021, 10:15 AM

## 2021-11-20 NOTE — Care Management Important Message (Signed)
Important Message  Patient Details  Name: COLLYN RIBAS MRN: [id number] Date of Birth: 06-01-1951   Medicare Important Message Given:        Dorena Bodo 11/20/2021, 3:06 PM

## 2021-11-20 NOTE — Progress Notes (Signed)
Dr Delton Coombes aware of Pts afib in the 120s. At this time, we are just monitoring post extubation. Will keep aline for accurate BP monitoring. Surgery made aware of OGT removal. Per surgery's recommendation, only insert NGT and place to LIS if c/o of vomiting - keep NPO overnight.

## 2021-11-20 NOTE — Progress Notes (Signed)
PCCM Interval Note  Evaluated patient on afternoon rounds. Tolerating PSV 5, PEEP 5 with adequate saturations, comfortable respiratory pattern. Follows commands, no significant secretions.  He does have abdominal pain with coughing.  Meets criteria for extubation, will place orders now.  Anticipate that he may need replacement of an NG tube for intermittent gastric suctioning postoperatively  Levy Pupa, MD, PhD 11/20/2021, 4:00 PM [facility] [phone] or if no answer before 7:00PM call [phone] For any issues after 7:00PM please call eLink [phone]

## 2021-11-20 NOTE — Procedures (Signed)
Extubation Procedure Note  Patient Details:   Name: Stephen Whitney DOB: 1951/04/17 MRN: [id number]   Airway Documentation:    Vent end date: 11/20/21 Vent end time: 1613   Evaluation  O2 sats: stable throughout Complications: No apparent complications Patient did tolerate procedure well. Bilateral Breath Sounds: Clear, Diminished   Yes,  Per MD order, pt was extubated to 3L [place]. Prior to extubation pt did have a positive cuff leak. Pt tolerated well with SVS. Pt was able to state his name. No complications and no stridor noted. RN currently at bedside. RT will continue to monitor pt.  Megan Mans 11/20/2021, 4:17 PM

## 2021-11-21 ENCOUNTER — Inpatient Hospital Stay (HOSPITAL_COMMUNITY): Payer: Medicare HMO

## 2021-11-21 DIAGNOSIS — I5032 Chronic diastolic (congestive) heart failure: Secondary | ICD-10-CM

## 2021-11-21 DIAGNOSIS — G9520 Unspecified cord compression: Secondary | ICD-10-CM | POA: Diagnosis not present

## 2021-11-21 DIAGNOSIS — J9601 Acute respiratory failure with hypoxia: Secondary | ICD-10-CM | POA: Diagnosis not present

## 2021-11-21 DIAGNOSIS — K631 Perforation of intestine (nontraumatic): Secondary | ICD-10-CM | POA: Diagnosis not present

## 2021-11-21 DIAGNOSIS — E440 Moderate protein-calorie malnutrition: Secondary | ICD-10-CM | POA: Insufficient documentation

## 2021-11-21 LAB — COMPREHENSIVE METABOLIC PANEL
ALT: 23 U/L (ref 0–44)
AST: 38 U/L (ref 15–41)
Albumin: 2.4 g/dL — ABNORMAL LOW (ref 3.5–5.0)
Alkaline Phosphatase: 23 U/L — ABNORMAL LOW (ref 38–126)
Anion gap: 9 (ref 5–15)
BUN: 29 mg/dL — ABNORMAL HIGH (ref 8–23)
CO2: 24 mmol/L (ref 22–32)
Calcium: 8.5 mg/dL — ABNORMAL LOW (ref 8.9–10.3)
Chloride: 105 mmol/L (ref 98–111)
Creatinine, Ser: 1.18 mg/dL (ref 0.61–1.24)
GFR, Estimated: >60 mL/min (ref >60)
Glucose, Bld: 122 mg/dL — ABNORMAL HIGH (ref 70–99)
Potassium: 4.2 mmol/L (ref 3.5–5.1)
Sodium: 138 mmol/L (ref 135–145)
Total Bilirubin: 0.9 mg/dL (ref 0.3–1.2)
Total Protein: 4.6 g/dL — ABNORMAL LOW (ref 6.5–8.1)

## 2021-11-21 LAB — GLUCOSE, CAPILLARY
Glucose-Capillary: 104 mg/dL — ABNORMAL HIGH (ref 70–99)
Glucose-Capillary: 111 mg/dL — ABNORMAL HIGH (ref 70–99)
Glucose-Capillary: 115 mg/dL — ABNORMAL HIGH (ref 70–99)
Glucose-Capillary: 120 mg/dL — ABNORMAL HIGH (ref 70–99)
Glucose-Capillary: 160 mg/dL — ABNORMAL HIGH (ref 70–99)
Glucose-Capillary: 95 mg/dL (ref 70–99)

## 2021-11-21 LAB — CBC
HCT: 26.7 % — ABNORMAL LOW (ref 39.0–52.0)
Hemoglobin: 9.2 g/dL — ABNORMAL LOW (ref 13.0–17.0)
MCH: 31.9 pg (ref 26.0–34.0)
MCHC: 34.5 g/dL (ref 30.0–36.0)
MCV: 92.7 fL (ref 80.0–100.0)
Platelets: 103 10*3/uL — ABNORMAL LOW (ref 150–400)
RBC: 2.88 MIL/uL — ABNORMAL LOW (ref 4.22–5.81)
RDW: 11.9 % (ref 11.5–15.5)
WBC: 9.3 10*3/uL (ref 4.0–10.5)
nRBC: 0 % (ref 0.0–0.2)

## 2021-11-21 LAB — SURGICAL PATHOLOGY

## 2021-11-21 LAB — MAGNESIUM: Magnesium: 2.1 mg/dL (ref 1.7–2.4)

## 2021-11-21 MED ORDER — METOPROLOL TARTRATE 25 MG/10 ML ORAL SUSPENSION
12.5000 mg | Freq: Two times a day (BID) | ORAL | Status: DC
  Administered 2021-11-21: 12.5 mg
  Filled 2021-11-21: qty 10

## 2021-11-21 MED ORDER — METOPROLOL TARTRATE 5 MG/5ML IV SOLN: 2.5000 mg | Freq: Once | INTRAVENOUS | Status: DC

## 2021-11-21 MED ORDER — SODIUM CHLORIDE 3 % IN NEBU
4.0000 mL | INHALATION_SOLUTION | Freq: Two times a day (BID) | RESPIRATORY_TRACT | Status: AC
  Administered 2021-11-21 – 2021-11-24 (×6): 4 mL via RESPIRATORY_TRACT
  Filled 2021-11-21 (×6): qty 4

## 2021-11-21 MED ORDER — AMIODARONE IV BOLUS ONLY 150 MG/100ML
150.0000 mg | Freq: Once | INTRAVENOUS | Status: DC
Start: 2021-11-21 — End: 2021-11-21

## 2021-11-21 MED ORDER — VITAL HIGH PROTEIN PO LIQD: 1000.0000 mL | ORAL | Status: DC

## 2021-11-21 MED ORDER — FUROSEMIDE 10 MG/ML IJ SOLN
40.0000 mg | Freq: Once | INTRAMUSCULAR | Status: AC
  Administered 2021-11-21: 40 mg via INTRAVENOUS
  Filled 2021-11-21: qty 4

## 2021-11-21 MED ORDER — LEVALBUTEROL HCL 0.63 MG/3ML IN NEBU
0.6300 mg | INHALATION_SOLUTION | Freq: Four times a day (QID) | RESPIRATORY_TRACT | Status: DC
Start: 2021-11-21 — End: 2021-11-26
  Administered 2021-11-21 – 2021-11-26 (×15): 0.63 mg via RESPIRATORY_TRACT
  Filled 2021-11-21 (×15): qty 3

## 2021-11-21 MED ORDER — OSMOLITE 1.5 CAL PO LIQD
1000.0000 mL | ORAL | Status: DC
  Administered 2021-11-21 – 2021-11-22 (×2): 1000 mL

## 2021-11-21 MED ORDER — AMIODARONE HCL IN DEXTROSE 360-4.14 MG/200ML-% IV SOLN
60.0000 mg/h | INTRAVENOUS | Status: AC
  Administered 2021-11-21 (×2): 60 mg/h via INTRAVENOUS
  Filled 2021-11-21 (×2): qty 200

## 2021-11-21 MED ORDER — AMIODARONE LOAD VIA INFUSION
150.0000 mg | Freq: Once | INTRAVENOUS | Status: AC
  Administered 2021-11-21: 150 mg via INTRAVENOUS
  Filled 2021-11-21: qty 83.34

## 2021-11-21 MED ORDER — MORPHINE SULFATE (PF) 2 MG/ML IV SOLN
2.0000 mg | INTRAVENOUS | Status: DC | PRN
  Administered 2021-11-21 – 2021-11-24 (×11): 2 mg via INTRAVENOUS
  Administered 2021-11-25: 3 mg via INTRAVENOUS
  Administered 2021-11-25: 2 mg via INTRAVENOUS
  Administered 2021-11-25: 3 mg via INTRAVENOUS
  Administered 2021-11-25: 4 mg via INTRAVENOUS
  Administered 2021-11-25 – 2021-11-26 (×2): 2 mg via INTRAVENOUS
  Administered 2021-11-26: 3 mg via INTRAVENOUS
  Filled 2021-11-21: qty 1
  Filled 2021-11-21: qty 2
  Filled 2021-11-21 (×8): qty 1
  Filled 2021-11-21: qty 2
  Filled 2021-11-21 (×2): qty 1
  Filled 2021-11-21 (×2): qty 2
  Filled 2021-11-21 (×4): qty 1

## 2021-11-21 MED ORDER — IPRATROPIUM-ALBUTEROL 0.5-2.5 (3) MG/3ML IN SOLN
3.0000 mL | Freq: Four times a day (QID) | RESPIRATORY_TRACT | Status: DC
  Administered 2021-11-21 (×2): 3 mL via RESPIRATORY_TRACT
  Filled 2021-11-21 (×2): qty 3

## 2021-11-21 MED ORDER — AMIODARONE HCL IN DEXTROSE 360-4.14 MG/200ML-% IV SOLN
30.0000 mg/h | INTRAVENOUS | Status: DC
  Administered 2021-11-22 – 2021-11-26 (×9): 30 mg/h via INTRAVENOUS
  Filled 2021-11-21 (×9): qty 200

## 2021-11-21 NOTE — Progress Notes (Signed)
[facility] Progress Note  2 Days Post-Op  Subjective: CC:  Extubated yesterday. Denies abdominal pain or nausea, requesting water.    Objective: Vital signs in last 24 hours: Temp:  [97.9 F (36.6 C)-99.1 F (37.3 C)] 98.3 F (36.8 C) (06/21 0717) Pulse Rate:  [78-133] 121 (06/21 0602) Resp:  [15-27] 18 (06/21 0602) BP: (86-124)/(56-73) 96/63 (06/21 0602) SpO2:  [83 %-100 %] 96 % (06/21 0602) Arterial Line BP: (94-157)/(49-73) 115/53 (06/21 0602) FiO2 (%):  [40 %-50 %] 40 % (06/20 1504) Last BM Date : 11/16/21  Intake/Output from previous day: 06/20 0701 - 06/21 0700 In: 1316.1 [I.V.:402.2; IV Piggyback:913.9] Out: 2000 [Urine:2000] Intake/Output this shift: Total I/O In: 10 [I.V.:10] Out: 100 [Urine:100]  PE: Gen: ill appearing Card:  Regular, tachy Pulm: slightly labored respirations  Abd: Soft, protuberant, nondistended nontender. Midline incision loosely closed with staples, minimal SS drainage. Ostomy viable with some SS drainage in ostomy pouch, no gas.  Skin: warm and dry  Lab Results:  Recent Labs    11/20/21 0514 11/20/21 1839 11/21/21 0459  WBC 8.4  --  9.3  HGB 9.1* 8.5* 9.2*  HCT 26.3* 25.0* 26.7*  PLT 99*  --  103*    BMET Recent Labs    11/20/21 0514 11/20/21 1839 11/21/21 0459  NA 136 138 138  K 4.3 4.2 4.2  CL 107  --  105  CO2 24  --  24  GLUCOSE 197*  --  122*  BUN 23  --  29*  CREATININE 1.11  --  1.18  CALCIUM 8.1*  --  8.5*    PT/INR Recent Labs    11/07/2021 1649  LABPROT 15.3*  INR 1.2    CMP     Component Value Date/Time   NA 138 11/21/2021 0459   K 4.2 11/21/2021 0459   CL 105 11/21/2021 0459   CO2 24 11/21/2021 0459   GLUCOSE 122 (H) 11/21/2021 0459   BUN 29 (H) 11/21/2021 0459   CREATININE 1.18 11/21/2021 0459   CALCIUM 8.5 (L) 11/21/2021 0459   PROT 4.6 (L) 11/21/2021 0459   ALBUMIN 2.4 (L) 11/21/2021 0459   AST 38 11/21/2021 0459   ALT 23 11/21/2021 0459   ALKPHOS 23 (L) 11/21/2021 0459    BILITOT 0.9 11/21/2021 0459   GFRNONAA >60 11/21/2021 0459   Lipase  No results found for: "LIPASE"     Studies/Results: DG Chest Port 1 View  Result Date: 11/21/2021 CLINICAL DATA:  Respiratory failure in a 71 year old male. EXAM: PORTABLE CHEST 1 VIEW COMPARISON:  November 20, 2021. FINDINGS: Cardiomediastinal contours and hilar structures are stable. Increasing opacity at the RIGHT lung base along with blunting of RIGHT costodiaphragmatic sulcus, RIGHT hemidiaphragm now obscured. Streaky linear opacities in the LEFT lower chest. No pneumothorax. On limited assessment there is no acute skeletal finding. IMPRESSION: 1. Increasing opacity at the RIGHT lung base likely related to increasing pleural fluid superimposed on known RIGHT lower lobe pneumonia or aspiration change. 2. Streaky linear opacities in the LEFT lower chest, likely atelectasis. Electronically Signed   By: Zetta Bills M.D.   On: 11/21/2021 07:21   DG CHEST PORT 1 VIEW  Result Date: 11/20/2021 CLINICAL DATA:  Hypoxia. EXAM: PORTABLE CHEST 1 VIEW COMPARISON:  November 19, 2021. FINDINGS: The heart size and mediastinal contours are within normal limits. Endotracheal and nasogastric tubes are unchanged in position. Minimal left basilar subsegmental atelectasis is noted. There is continued atelectasis or pneumonia of the right middle and lower  lobes with possible associated pleural effusion. The visualized skeletal structures are unremarkable. IMPRESSION: Stable support apparatus. Continued atelectasis or pneumonia of right middle and lower lobes with possible associated pleural effusion. Electronically Signed   By: Lupita Raider M.D.   On: 11/20/2021 08:17   DG CHEST PORT 1 VIEW  Result Date: 11/02/2021 CLINICAL DATA:  Acute respiratory failure with hypoxemia. EXAM: PORTABLE CHEST 1 VIEW COMPARISON:  November 19, 2021 (10:29 a.m.) FINDINGS: Since the prior study, there is been interval placement of an endotracheal tube. Its distal tip  sits approximally 5.0 cm from the carina. Interval nasogastric tube placement is also seen with its distal tip overlying the expected region of the gastroesophageal junction. There is stable moderate severity right basilar and mild left basilar atelectasis and/or infiltrate. A small, stable right pleural effusion is noted. The heart size and mediastinal contours are within normal limits. There is mild calcification of the aortic arch. The intra-abdominal free air seen on the prior study is not clearly visualized on the current exam. The visualized skeletal structures are unremarkable. IMPRESSION: 1. Interval endotracheal tube and nasogastric tube placement positioning, as described above. Further advancement of the NG tube by approximately 7 cm is recommended to decrease the risk of aspiration. 2. Stable moderate severity right basilar and mild left basilar atelectasis and/or infiltrate. 3. Stable small right pleural effusion. Electronically Signed   By: Aram Candela M.D.   On: 11/22/2021 21:54   CT CHEST ABDOMEN PELVIS W CONTRAST  Result Date: 11/20/2021 CLINICAL DATA:  Peritonitis or perforation suspected EXAM: CT CHEST, ABDOMEN, AND PELVIS WITH CONTRAST TECHNIQUE: Multidetector CT imaging of the chest, abdomen and pelvis was performed following the standard protocol during bolus administration of intravenous contrast. RADIATION DOSE REDUCTION: This exam was performed according to the departmental dose-optimization program which includes automated exposure control, adjustment of the mA and/or kV according to patient size and/or use of iterative reconstruction technique. CONTRAST:  OMNIPAQUE IOHEXOL 300 MG/ML  SOLN COMPARISON:  None Available. FINDINGS: CT CHEST FINDINGS Cardiovascular: Aortic atherosclerosis. Normal heart size. Left coronary artery calcifications. No pericardial effusion. Mediastinum/Nodes: No enlarged mediastinal, hilar, or axillary lymph nodes. Thyroid gland, trachea, and  esophagus demonstrate no significant findings. Lungs/Pleura: Examination of the lungs is limited by breath motion artifact within this limitation, complete consolidation or atelectasis of the right lower and right middle lobes with air bronchograms and a small associated pleural effusion. Trace left pleural effusion. Musculoskeletal: No chest wall mass or suspicious osseous lesions identified. CT ABDOMEN PELVIS FINDINGS Hepatobiliary: No solid liver abnormality is seen. No gallstones, gallbladder wall thickening, or biliary dilatation. Pancreas: Unremarkable. No pancreatic ductal dilatation or surrounding inflammatory changes. Spleen: Normal in size without significant abnormality. Adrenals/Urinary Tract: Adrenal glands are unremarkable. Severely atrophic right kidney. The left kidney is normal, without renal calculi, solid lesion, or hydronephrosis. Foley catheter in the urinary bladder. Stomach/Bowel: Stomach is within normal limits. Appendix is normal. Extraluminal air locules in the small bowel mesentery adjacent to a decompressed, diverticular appearing loop of distal small bowel in the central abdomen (series 3, image 101, series 7, image 50). Descending and sigmoid diverticulosis. Vascular/Lymphatic: No significant vascular findings are present. No enlarged abdominal or pelvic lymph nodes. Reproductive: No mass or other abnormality. Other: Fat containing umbilical hernia. Anasarca. Small volume pneumoperitoneum. Musculoskeletal: No acute osseous findings. IMPRESSION: 1. Small volume pneumoperitoneum. Extraluminal air locules in the small bowel mesentery adjacent to a decompressed, diverticular appearing loop of distal small bowel in the central abdomen, suggesting  nidus of bowel perforation. No distended bowel loops or evidence of obstruction. 2. Examination of the lungs is limited by breath motion artifact. Within this limitation, complete consolidation or atelectasis of the right lower and right middle  lobes with air bronchograms and a small associated pleural effusion. Findings suggest infection or aspiration. 3. Coronary artery disease. Aortic Atherosclerosis (ICD10-I70.0). Electronically Signed   By: Delanna Ahmadi M.D.   On: 11/08/2021 15:41   DG Chest 2 View  Addendum Date: 11/05/2021   ADDENDUM REPORT: 11/08/2021 11:06 ADDENDUM: Critical Value/emergent results were called by telephone at the time of interpretation on 11/07/2021 at 11:06 am to provider DR. DEAN, who verbally acknowledged these results. Electronically Signed   By: Ilona Sorrel M.D.   On: 11/05/2021 11:06   Result Date: 11/13/2021 CLINICAL DATA:  Dyspnea EXAM: CHEST - 2 VIEW COMPARISON:  11/23/2021 chest radiograph. FINDINGS: Motion degraded lateral view. Stable cardiomediastinal silhouette with normal heart size. No pneumothorax. Volume loss in the right hemithorax with rightward mediastinal shift, slightly worsened. Patchy right lung base opacity is slightly increased. Minimal left basilar atelectasis is stable. No pulmonary edema. Apparent free air under the right hemidiaphragm. Possible trace left pleural effusion. IMPRESSION: 1. Apparent free air under the right hemidiaphragm, cannot exclude bowel perforation. CT abdomen/pelvis with oral and IV contrast recommended for further evaluation. 2. Volume loss in the right hemithorax with patchy right lung base opacity, mildly worsened., likely atelectasis, cannot exclude a component of aspiration or pneumonia. 3. Possible trace left pleural effusion. Electronically Signed: By: Ilona Sorrel M.D. On: 11/30/2021 10:55   DG CHEST PORT 1 VIEW  Addendum Date: 11/10/2021   ADDENDUM REPORT: 11/25/2021 09:11 ADDENDUM: Relative lucency along the right diaphragm adjacent to the area of airspace disease likely reflecting partially aerated lung, but if there is concern for pneumoperitoneum recommend an abdominal series for further evaluation. These results were called by telephone at the time of  interpretation on 11/14/2021 at 9:11 am to provider Velna Ochs , who verbally acknowledged these results. Electronically Signed   By: Kathreen Devoid M.D.   On: 11/18/2021 09:11   Result Date: 11/09/2021 CLINICAL DATA:  Dyspnea EXAM: PORTABLE CHEST 1 VIEW COMPARISON:  None Available. FINDINGS: Right lower lobe airspace disease concerning for pneumonia. No pleural effusion or pneumothorax. Heart and mediastinal contours are unremarkable. No acute osseous abnormality. IMPRESSION: 1. Right lower lobe airspace disease concerning for pneumonia. Electronically Signed: By: Kathreen Devoid M.D. On: 11/27/2021 08:54    Anti-infectives: Anti-infectives (From admission, onward)    Start     Dose/Rate Route Frequency Ordered Stop   11/20/21 1400  metroNIDAZOLE (FLAGYL) IVPB 500 mg  Status:  Discontinued        500 mg 100 mL/hr over 60 Minutes Intravenous Every 12 hours 11/20/21 0755 11/20/21 0834   11/20/21 1400  ciprofloxacin (CIPRO) IVPB 400 mg  Status:  Discontinued        400 mg 200 mL/hr over 60 Minutes Intravenous Every 12 hours 11/20/21 0755 11/20/21 0834   11/20/21 1000  ciprofloxacin (CIPRO) IVPB 400 mg        400 mg 200 mL/hr over 60 Minutes Intravenous Every 12 hours 11/20/21 0834     11/20/21 1000  metroNIDAZOLE (FLAGYL) IVPB 500 mg        500 mg 100 mL/hr over 60 Minutes Intravenous Every 12 hours 11/20/21 0834     11/30/2021 1745  piperacillin-tazobactam (ZOSYN) IVPB 3.375 g  Status:  Discontinued  3.375 g 12.5 mL/hr over 240 Minutes Intravenous Every 8 hours 11/12/2021 1651 11/20/21 0755   11/15/2021 1015  ampicillin-sulbactam (UNASYN) 1.5 g in sodium chloride 0.9 % 100 mL IVPB  Status:  Discontinued        1.5 g 200 mL/hr over 30 Minutes Intravenous Every 6 hours 11/01/2021 0918 11/15/2021 1651   11/10/2021 2030  ceFAZolin (ANCEF) IVPB 2g/100 mL premix        2 g 200 mL/hr over 30 Minutes Intravenous Every 8 hours 11/30/2021 1944 11/18/21 0507   11/06/2021 1312  ceFAZolin (ANCEF) 2-4 GM/100ML-%  IVPB       Note to Pharmacy: Crissie Sickles: cabinet override      11/10/2021 1312 11/18/21 0129        Assessment/Plan Pneumoperitoneum Acute sigmoid diverticulitis with perforation  S/P  Exploratory laparotomy, sigmoid resection, end sigmoid colostomy, primary repair of small bowel serosal injury 11/08/2021 Dr. Sophronia Simas - POD#2, ongoing sepsis related to both aspiration PNA (predates surgery) and diverticulitis (minimal contamination noted by operative surgeon). Mgmt of sepsis per CCM  - Ok to start sips of clears when cleared for swallowing - WOC RN consulted for new ostomy - daily dry dressing changes to midline wound and as needed for saturation   FEN: NPO/ sips, IVF  ID: Zosyn 6/19 >> VTE: SCD's, currently not on chemical VTE, afib currently rate controlled and platelets < 100K would continue hold heparin gtt for today. Foley: in place for strict I&Os, and may need to stay in place for neurogenic bladder. Per NS   Acute Respiratory failure Aspiration PNA COPD Quadriplegia due to Herniated nucleus pulposis at C5-C6 s/p anterior cervical decompression and fixation 6/17 Dr. Danielle Dess AKI  Afib on Eliquis at home (held) Heart Failure with preserved EF   LOS: 4 days   I reviewed nursing notes, Consultant CCM, NS notes, last 24 h vitals and pain scores, last 48 h intake and output, last 24 h labs and trends, and last 24 h imaging results.   Berna Bue MD [facility] Please see Amion for pager number during day hours 7:00am-4:30pm

## 2021-11-21 NOTE — Progress Notes (Signed)
RT called to patient room due to patient noted to have increased secretions with a weak cough.  RN had attempted NTS and was able to obtain a small amount of secretions however patient still noted to have secretions upon RTs arrival to room.  RT asked patient if it had made him feel better once he was NTS and patient stated that he thought so.  When asked patient if RT could attempt to NTS he told RT no that he wasn't doing that again.  Patient is noted to have accessory muscle use but sats are 96% on [place].  Hypertonic saline to be given with next nebulizer treatment.  Will continue to monitor.

## 2021-11-21 NOTE — Progress Notes (Signed)
Inpatient Rehab Admissions Coordinator:    Pt. Now off the vent, wife at bedside. I spoke with them about rehab options (CIR vs SNF). Pt.'s wife states that she does not think she can care for patient after CIR as she works and has minimal help at home. She states that they would prefer short term rehab at SNF instead and would like to look at facilities in [place]. CIR will sign off.   Megan Salon, MS, CCC-SLP Rehab Admissions Coordinator  [phone] (celll) [phone] (office)

## 2021-11-21 NOTE — Progress Notes (Signed)
Physical Therapy Treatment Patient Details Name: Stephen Whitney MRN: [id number] DOB: August 31, 1950 Today's Date: 11/21/2021   History of Present Illness 71 y/o male presented to ED on 2021/12/15 for complaints of neck pain and stiffness along with numbness in UEs and weakness in LEs. 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 S/p ACDF C5-6 on 6/17. 6/19 acute respiratory distress, free air on CT abd/pelvis, found to have perforated sigmoid diverticulitis > emergent OR 6/19.  6/20  extubated.  PMH: CHF, COPD, Afib, HTN, CVA    PT Comments    Pt now off the vent and able to start mobilizing.  Limited significantly by limited function bil UE's and no movement of the LE's.  Emphasis on function/strength assessment of U and LE's/ PROM of Bil LE's, rolling, transition to sitting EOB with >15 min sitting EOB with addition of lumbar sheet to attain neutral pelvic tilt with Upper body support and work on extension and shoulder/scapular retraction.    Recommendations for follow up therapy are one component of a multi-disciplinary discharge planning process, led by the attending physician.  Recommendations may be updated based on patient status, additional functional criteria and insurance authorization.  Follow Up Recommendations  Acute inpatient rehab (3hours/day)     Assistance Recommended at Discharge Frequent or constant Supervision/Assistance  Patient can return home with the following A lot of help with walking and/or transfers;A lot of help with bathing/dressing/bathroom;Assistance with cooking/housework;Assist for transportation;Help with stairs or ramp for entrance   Equipment Recommendations  Wheelchair (measurements PT);Wheelchair cushion (measurements PT);Hospital bed    Recommendations for Other Services       Precautions / Restrictions Precautions Precautions: Fall;Cervical Other Brace: no brace needed per MD     Mobility  Bed Mobility Overal bed  mobility: Needs Assistance Bed Mobility: Rolling, Sidelying to Sit, Sit to Sidelying Rolling: Total assist, +2 for physical assistance Sidelying to sit: +2 for physical assistance, Total assist     Sit to sidelying: Total assist, +2 for physical assistance General bed mobility comments: cues for sequencing of rolling and transition.  Positioned pt to come up/ down via L elbow.    Transfers                   General transfer comment: deferred    Ambulation/Gait                   Stairs             Wheelchair Mobility    Modified Rankin (Stroke Patients Only)       Balance Overall balance assessment: Needs assistance Sitting-balance support: No upper extremity supported, Bilateral upper extremity supported, Feet supported, Feet unsupported Sitting balance-Leahy Scale: Poor Sitting balance - Comments: sat EOB ~15 min working on good midline alignment, adding UE support.  With lumbar sheet, worked on upright sitting with assisted pelvic tilt to neutral with shoulder/scapular retraction.                                    Cognition Arousal/Alertness: Awake/alert Behavior During Therapy: Flat affect (smiled at end of session) Overall Cognitive Status:  (NT formally, follow 1-step commands)  Exercises Other Exercises Other Exercises: PROM bil LE's    General Comments General comments (skin integrity, edema, etc.): Bil LE, trace gross extension from proximal muscles, no movement/assist noted from LE musculature.  Pt generally insensate bilaterally      Pertinent Vitals/Pain Pain Assessment Pain Assessment: Faces Faces Pain Scale: Hurts little more Pain Location: abdominal sx site, neck Pain Descriptors / Indicators: Discomfort, Grimacing Pain Intervention(s): Limited activity within patient's tolerance, Monitored during session, Repositioned    Home Living                           Prior Function            PT Goals (current goals can now be found in the care plan section) Acute Rehab PT Goals Patient Stated Goal: ultimately get back home. PT Goal Formulation: With patient/family Time For Goal Achievement: 12/02/21 Potential to Achieve Goals: Fair Progress towards PT goals: Progressing toward goals    Frequency    Min 4X/week      PT Plan Current plan remains appropriate    Co-evaluation PT/OT/SLP Co-Evaluation/Treatment: Yes Reason for Co-Treatment: Complexity of the patient's impairments (multi-system involvement);For patient/therapist safety PT goals addressed during session: Mobility/safety with mobility OT goals addressed during session: ADL's and self-care;Strengthening/ROM      AM-PAC PT "6 Clicks" Mobility   Outcome Measure  Help needed turning from your back to your side while in a flat bed without using bedrails?: Total Help needed moving from lying on your back to sitting on the side of a flat bed without using bedrails?: Total Help needed moving to and from a bed to a chair (including a wheelchair)?: Total Help needed standing up from a chair using your arms (e.g., wheelchair or bedside chair)?: Total Help needed to walk in hospital room?: Total Help needed climbing 3-5 steps with a railing? : Total 6 Click Score: 6    End of Session Equipment Utilized During Treatment: Oxygen Activity Tolerance: Patient tolerated treatment well;Patient limited by fatigue Patient left: in bed;with call bell/phone within reach;with family/visitor present Nurse Communication: Mobility status PT Visit Diagnosis: Other abnormalities of gait and mobility (R26.89);Muscle weakness (generalized) (M62.81);Other symptoms and signs involving the nervous system (R29.898)     Time: 3267-1245 PT Time Calculation (min) (ACUTE ONLY): 43 min  Charges:  $Therapeutic Exercise: 8-22 mins $Therapeutic Activity: 8-22 mins                      11/21/2021  Jacinto Halim., PT Acute Rehabilitation Services [phone]  (pager) [phone]  (office)   Stephen Whitney 11/21/2021, 12:21 PM

## 2021-11-21 NOTE — Progress Notes (Addendum)
NAME:  Stephen Whitney, MRN:  [id number], DOB:  03-03-1951, LOS: 4 ADMISSION DATE:  11/15/2021, CONSULTATION DATE:  11/21/21 REFERRING MD: Zenia Resides , CHIEF COMPLAINT:  Perforated sigmoid diverticulitis   History of Present Illness:  Stephen Whitney is a 71 y.o. M with PMH significant for Atrial fibrillation, CAD and MI, CHF, COPD, CVA who presented 6/17 with numbness, tingling and weakness in his arms and leg and pain of the back and neck.  He was diagnosed with C5-C6 compression and required emergent decompression.  On 6/19, pt developed acute respiratory distress requiring non-rebreather.  CXR was concerning for free air and CT abd/pelvis concerning for bowel perforation.  Surgery was consulted and pt taken to the OR for emergent ex-lap which revealed perforated sigmoid diverticulitis.  He was left intubated post-op and transferred to the ICU.  Pertinent  Medical History   has a past medical history of A-fib ([place]), CHF (congestive heart failure) ([place]), COPD (chronic obstructive pulmonary disease) ([place]), Dupuytren's contracture of both hands, Dysrhythmia, GAD (generalized anxiety disorder), Hepatitis C, History of atrial fibrillation (11/01/2021), History of CVA (cerebrovascular accident) (11/07/2021), History of MI (myocardial infarction) (11/14/2021), Hypertension, Osteoarthritis, and Stroke ([place]).   Significant Hospital Events: Including procedures, antibiotic start and stop dates in addition to other pertinent events   6/17 admitted with C5-C6 compression requiring emergent decompression 6/19 acute respiratory distress, free air on CT abd/pelvis, found to have perforated sigmoid diverticulitis > emergent OR 6/19 6/19 rash in the OR with Zosyn, had also received Unasyn.  Changed to Cipro/Flagyl on 6/20 Extubated 6/20.  Some subsequent tachypnea, A-fib/RVR  Interim History / Subjective:   Extubated 6/20 > some tachypnea, hypoxemia A fib HR 130's post extubation, received amiodarone, Lasix [place]  4L/min I/O -670cc/24h S Cr 1.18 Plt 99 > 103 Hgb 9.2   Objective   Blood pressure 96/63, pulse (!) 121, temperature 98 F (36.7 C), temperature source Oral, resp. rate 18, height 5\' 6"  (1.676 m), weight 83 kg, SpO2 96 %.    Vent Mode: PSV;CPAP FiO2 (%):  [40 %-90 %] 40 % Set Rate:  [24 bmp] 24 bmp Vt Set:  [510 mL] 510 mL PEEP:  [5 cmH20-10 cmH20] 5 cmH20 Pressure Support:  [5 cmH20] 5 cmH20 Plateau Pressure:  [14 cmH20-15 cmH20] 14 cmH20   Intake/Output Summary (Last 24 hours) at 11/21/2021 0707 Last data filed at 11/21/2021 0600 Gross per 24 hour  Intake 1306.13 ml  Output 2000 ml  Net -693.87 ml   Filed Weights   11/03/2021 1302 11/05/2021 1310  Weight: 83 kg 83 kg   General: Critically ill gentleman, stable HEENT: Strong voice, no upper airway secretions Neuro: Awake, interacting, answers questions and follows commands.  Able to move upper extremities.  He cannot move either lower extremity CV: Irregularly irregular, A-fib on monitor, distant, no murmur PULM: Decreased to both bases.  Using some accessory abdominal muscles to breathe.  No wheezing GI: Ostomy in place, no stool yet Extremities: Trace lower extremity edema Skin: Improving erythematous macular rash on trunk   Chest x-ray 6/21 reviewed by me shows increased RLL atx and probable effusion. Increased interstitial markings B  Resolved Hospital Problem list     Assessment & Plan:   Acute Hypoxic and Hypercarbic Respiratory Failure secondary to possible aspiration Pneumonia and underlying COPD.  Consider contribution acute diastolic CHF in the setting of A-fib -Push pulmonary hygiene -Ensure adequate pain control so that abdominal noncompliance does not contribute to poor secretion clearance, restrictive physiology -Rate control, diuresis  as tolerated Follow chest x-ray -BD available if needed  Shock, likely septic shock + possible contribution sedating meds Pneumoperitoneum secondary to perforated Sigmoid  Diverticula with presumed peritonitis  -Post emergent ex lap and ostomy creation for sigmoid perforation and peritonitis -Appreciate general surgery management -Continue n.p.o. -Antibiotics adjusted to Cipro/Flagyl on 6/20, question rash treated with Zosyn and Unasyn -Norepinephrine weaned off  Atrial Fibrillation with RVR postoperatively and postextubation and associated acute on chronic HFpEF -Received amiodarone 150 mg 6/20, consider infusion depending on heart rate trend. -Restart home rate control with able to do so, currently n.p.o. -Holding Eliquis -Dose diuretics daily depending on clinical status, renal function, blood pressure  Erythematous Rash Most likely allergic, unclear etiology, on trunk, groin and face, sparing extremities -Received Benadryl 50mg ,, continue systemic steroids for 1 more day -Antibiotics changed as above  Recent C5-C6 Compression with recent emergent decompression -Appreciate management by neurosurgery.  Will discuss utility, duration of Solu-Medrol post decompression  AKI, at risk but ruled out S Cr 1.16 > 1.29 > 1.11 -Decreased maintenance IV fluids 6/20, bolus as indicated -Follow urine output, BMP.  Currently stable -Continue to Ensure adequate renal perfusion, renal dose medications   Hyperglycemia, contribution steroids -Continue CBG, sliding scale insulin coverage  Stage I pressure injury, not POA noted due to crit illness and immobility.  Following WOC recs  Nutrition -N.p.o. for now, will plan to try trickle TF when okay with general surgery   Best Practice (right click and "Reselect all SmartList Selections" daily)   Diet/type: NPO DVT prophylaxis: SCD GI prophylaxis: PPI Lines: N/A Foley:  Yes, and it is still needed Code Status:  full code Last date of multidisciplinary goals of care discussion [pending]  Labs   CBC: Recent Labs  Lab 11/07/2021 0756 11/18/21 0315 11/18/21 1753 11/16/2021 0337 11/25/2021 1904 11/23/2021 1954  11/22/2021 2142 11/20/21 0514 11/20/21 1839 11/21/21 0459  WBC 10.8* 17.0* 16.9* 14.4*  --   --   --  8.4  --  9.3  NEUTROABS 8.1*  --   --   --   --   --   --   --   --   --   HGB 11.7* 12.3* 11.1* 10.8*   < > 10.9* 9.2* 9.1* 8.5* 9.2*  HCT 33.1* 34.3* 30.8* 31.2*   < > 32.0* 27.0* 26.3* 25.0* 26.7*  MCV 91.4 90.0 90.9 93.4  --   --   --  92.3  --  92.7  PLT 144* 149* 124* 116*  --   --   --  99*  --  103*   < > = values in this interval not displayed.    Basic Metabolic Panel: Recent Labs  Lab 11/21/2021 0756 11/18/21 0315 11/25/2021 0337 11/25/2021 1904 11/29/2021 1954 11/30/2021 2142 11/20/21 0514 11/20/21 1839 11/21/21 0459  NA 135 135 133*   < > 136 136 136 138 138  K 3.5 4.0 4.4   < > 4.9 4.9 4.3 4.2 4.2  CL 105 100 104  --   --   --  107  --  105  CO2 23 24 21*  --   --   --  24  --  24  GLUCOSE 102* 169* 130*  --   --   --  197*  --  122*  BUN 11 17 33*  --   --   --  23  --  29*  CREATININE 1.16 1.20 1.29*  --   --   --  1.11  --  1.18  CALCIUM 7.7* 8.8* 8.5*  --   --   --  8.1*  --  8.5*  MG  --   --   --   --   --   --   --   --  2.1   < > = values in this interval not displayed.   GFR: Estimated Creatinine Clearance: 59.8 mL/min (by C-G formula based on SCr of 1.18 mg/dL). Recent Labs  Lab 11/18/21 1753 11/06/2021 0337 11/13/2021 1649 11/20/21 0514 11/21/21 0459  WBC 16.9* 14.4*  --  8.4 9.3  LATICACIDVEN  --   --  2.3*  --   --     Liver Function Tests: Recent Labs  Lab 12/12/21 0756 11/18/21 0315 11/18/2021 0337 11/20/21 0514 11/21/21 0459  AST 14* 19 25 38 38  ALT 15 19 15 17 23   ALKPHOS 30* 33* 26* 18* 23*  BILITOT 0.5 1.1 1.1 0.9 0.9  PROT 4.4* 5.3* 4.9* 4.5* 4.6*  ALBUMIN 2.8* 3.1* 2.8* 2.5* 2.4*   No results for input(s): "LIPASE", "AMYLASE" in the last 168 hours. No results for input(s): "AMMONIA" in the last 168 hours.  ABG    Component Value Date/Time   PHART 7.459 (H) 11/20/2021 1839   PCO2ART 34.7 11/20/2021 1839   PO2ART 51 (L)  11/20/2021 1839   HCO3 24.7 11/20/2021 1839   TCO2 26 11/20/2021 1839   ACIDBASEDEF 1.0 11/18/2021 2142   O2SAT 89 11/20/2021 1839     Coagulation Profile: Recent Labs  Lab 11/13/2021 1649  INR 1.2    Cardiac Enzymes: No results for input(s): "CKTOTAL", "CKMB", "CKMBINDEX", "TROPONINI" in the last 168 hours.  HbA1C: Hgb A1c MFr Bld  Date/Time Value Ref Range Status  11/20/2021 05:14 AM 5.4 4.8 - 5.6 % Final    Comment:    (NOTE) Pre diabetes:          5.7%-6.4%  Diabetes:              >6.4%  Glycemic control for   <7.0% adults with diabetes     CBG: Recent Labs  Lab 11/20/21 1157 11/20/21 1518 11/20/21 1927 11/20/21 2339 11/21/21 0338  GLUCAP 161* 146* 137* 140* 115*     Critical care time: 32 minutes    CRITICAL CARE Performed by: 11/23/21   Total critical care time: 32 minutes  Critical care time was exclusive of separately billable procedures and treating other patients.  Critical care was necessary to treat or prevent imminent or life-threatening deterioration.  Critical care was time spent personally by me on the following activities: development of treatment plan with patient and/or surrogate as well as nursing, discussions with consultants, evaluation of patient's response to treatment, examination of patient, obtaining history from patient or surrogate, ordering and performing treatments and interventions, ordering and review of laboratory studies, ordering and review of radiographic studies, pulse oximetry and re-evaluation of patient's condition.   Leslye Peer, MD, PhD 11/21/2021, 7:07 AM [facility] [phone] or if no answer before 7:00PM call [phone] For any issues after 7:00PM please call eLink [phone]

## 2021-11-21 NOTE — Procedures (Signed)
Cortrak  Person Inserting Tube:  Clovis Riley, Treylin Burtch L, RD Tube Type:  Cortrak - 43 inches Tube Size:  10 Tube Location:  Right nare Secured by: Bridle Technique Used to Measure Tube Placement:  Marking at nare/corner of mouth Cortrak Secured At:  70 cm   Cortrak Tube Team Note:  Consult received to place a Cortrak feeding tube.   X-ray is required, abdominal x-ray has been ordered by the Cortrak team. Please confirm tube placement before using the Cortrak tube.   If the tube becomes dislodged please keep the tube and contact the Cortrak team at [email or web address] (password [email or web address]) for replacement.  If after hours and replacement cannot be delayed, place a NG tube and confirm placement with an abdominal x-ray.    Kirby Crigler RD, LDN Clinical Dietitian See Loretha Stapler for contact information.

## 2021-11-21 NOTE — Consult Note (Signed)
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 Output: 25 cc bloody Ostomy pouching: 2pc.  2 3/4 skin barrier and pouch. Barrier ring 2 inch.  Education provided:  Pt recently extubated, wife at bedside engaged in learning. Wife reports good support system at home.   Explained role of ostomy nurse and creation of stoma  Explained stoma characteristics ( flush, color, care) Demonstrated pouch change (cutting new skin barrier, measuring stoma, cleaning peristomal skin and stoma, use of barrier ring) Education on emptying when 1/3 to 1/2 full and how to empty    Enrolled patient in [employer] Discharge program: Yes  Student WOC- Leslye Peer [facility] Nurse will follow along with you for continued support with ostomy teaching and care Amberleigh Gerken [facility] MSN, RN, [place], CNS, [place] [phone]

## 2021-11-21 NOTE — Progress Notes (Signed)
Occupational Therapy Treatment Patient Details Name: Stephen Whitney MRN: [id number] DOB: [birth year] Today's Date: 11/21/2021   History of present illness 71 y/o male presented to ED on 11/21/2021 for complaints of neck pain and stiffness along with numbness in UEs and weakness in LEs. 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 S/p ACDF C5-6 on 6/17. 6/19 acute respiratory distress, free air on CT abd/pelvis, found to have perforated sigmoid diverticulitis > emergent OR 6/19.  6/20  extubated.  PMH: CHF, COPD, Afib, HTN, CVA   OT comments  Pt progressing towards acute OT goals. Today, pt able to tolerate sitting EOB for about 15 minutes with assist/supports in place. Limited significantly by limited function bil UE's and no movement of the LE's. Worked on trunk extension, scapular retraction, breathing exercises EOB. Spouse present throughout.   Recommendations for follow up therapy are one component of a multi-disciplinary discharge planning process, led by the attending physician.  Recommendations may be updated based on patient status, additional functional criteria and insurance authorization.    Follow Up Recommendations  Acute inpatient rehab (3hours/day)    Assistance Recommended at Discharge Frequent or constant Supervision/Assistance  Patient can return home with the following  Two people to help with walking and/or transfers;Two people to help with bathing/dressing/bathroom;Assistance with cooking/housework;Assistance with feeding;Direct supervision/assist for medications management;Direct supervision/assist for financial management;Assist for transportation;Help with stairs or ramp for entrance   Equipment Recommendations  Other (comment) (defer to next venue)    Recommendations for Other Services      Precautions / Restrictions Precautions Precautions: Fall;Cervical Other Brace: no brace needed per MD Restrictions Weight Bearing Restrictions: No        Mobility Bed Mobility Overal bed mobility: Needs Assistance Bed Mobility: Rolling, Sidelying to Sit, Sit to Sidelying Rolling: Total assist, +2 for physical assistance Sidelying to sit: +2 for physical assistance, Total assist     Sit to sidelying: Total assist, +2 for physical assistance General bed mobility comments: cues for sequencing of rolling and transition.  Positioned pt to come up/ down via L elbow.    Transfers                   General transfer comment: deferred     Balance Overall balance assessment: Needs assistance Sitting-balance support: No upper extremity supported, Bilateral upper extremity supported, Feet supported, Feet unsupported Sitting balance-Leahy Scale: Poor Sitting balance - Comments: sat EOB ~15 min working on good midline alignment, adding UE support.  With lumbar sheet, worked on upright sitting with assisted pelvic tilt to neutral with shoulder/scapular retraction.                                   ADL either performed or assessed with clinical judgement   ADL Overall ADL's : Needs assistance/impaired                                       General ADL Comments: tolerated sitting EOB for around 15 minutes with max assist +2. incorporated breathing exercises and tried to work on trunk extension.    Extremity/Trunk Assessment Upper Extremity Assessment Upper Extremity Assessment: RUE deficits/detail;LUE deficits/detail RUE Deficits / Details: ~40* shoulder flexion, 2-/5 strength, hx of dupuytren's contracutre, 4th & 5th digits contracted RUE Sensation: WNL RUE Coordination: decreased fine motor;decreased gross motor  LUE Deficits / Details: minimal AROM noted, dupuytren's contracutre with 4th & 5th digits contracted LUE Sensation: WNL LUE Coordination: decreased fine motor;decreased gross motor   Lower Extremity Assessment Lower Extremity Assessment: Defer to PT evaluation        Vision        Perception     Praxis      Cognition Arousal/Alertness: Awake/alert Behavior During Therapy: Flat affect (smiled at end of session) Overall Cognitive Status:  (NT formally, follow 1-step commands)                                 General Comments: responding to questions and following one commands appropriately        Exercises      Shoulder Instructions       General Comments Bil LE, trace gross extension from proximal muscles, no movement/assist noted from LE musculature.  Pt generally insensate bilaterally    Pertinent Vitals/ Pain       Pain Assessment Pain Assessment: Faces Faces Pain Scale: Hurts little more Pain Location: abdominal sx site, neck Pain Descriptors / Indicators: Discomfort, Grimacing Pain Intervention(s): Limited activity within patient's tolerance, Monitored during session, Repositioned  Home Living                                          Prior Functioning/Environment              Frequency  Min 2X/week        Progress Toward Goals  OT Goals(current goals can now be found in the care plan section)  Progress towards OT goals: Progressing toward goals  Acute Rehab OT Goals Patient Stated Goal: none stated OT Goal Formulation: With patient/family Time For Goal Achievement: 12/02/21 Potential to Achieve Goals: Fair ADL Goals Pt Will Perform Grooming: with min assist;bed level Pt Will Perform Upper Body Dressing: with mod assist;bed level Additional ADL Goal #1: Pt will complete bed mobility with mod A +2 in prep for ADLs  Plan Discharge plan remains appropriate    Co-evaluation    PT/OT/SLP Co-Evaluation/Treatment: Yes Reason for Co-Treatment: Complexity of the patient's impairments (multi-system involvement);For patient/therapist safety PT goals addressed during session: Mobility/safety with mobility OT goals addressed during session: ADL's and self-care;Strengthening/ROM      AM-PAC OT "6  Clicks" Daily Activity     Outcome Measure   Help from another person eating meals?: Total (NPO) Help from another person taking care of personal grooming?: A Lot Help from another person toileting, which includes using toliet, bedpan, or urinal?: Total Help from another person bathing (including washing, rinsing, drying)?: Total Help from another person to put on and taking off regular upper body clothing?: Total Help from another person to put on and taking off regular lower body clothing?: Total 6 Click Score: 7    End of Session    OT Visit Diagnosis: Unsteadiness on feet (R26.81);Other abnormalities of gait and mobility (R26.89);Muscle weakness (generalized) (M62.81);Other symptoms and signs involving the nervous system (R29.898)   Activity Tolerance Patient tolerated treatment well;Patient limited by fatigue;Patient limited by pain   Patient Left in bed;with call bell/phone within reach;with family/visitor present;with SCD's reapplied   Nurse Communication Mobility status        Time: 1117-1200 OT Time Calculation (min): 43 min  Charges: OT General Charges $OT Visit:  1 Visit OT Treatments $Self Care/Home Management : 8-22 mins  Raynald Kemp, OT Acute Rehabilitation Services Office: [phone]   Pilar Grammes 11/21/2021, 12:44 PM

## 2021-11-21 NOTE — Progress Notes (Signed)
Resident Elige Radon MD made aware of pt's afib in the 130s. Right now we are monitoring post PT/OT therapy and nebulizer tx. Byrum MD attending and Ephriam Knuckles MD okay with HR in the 110s.

## 2021-11-21 NOTE — Progress Notes (Signed)
Patient ID: Stephen Whitney, male   DOB: 1951-03-18, 71 y.o.   MRN: [id number] BP 91/62   Pulse (!) 120   Temp 97.7 F (36.5 C) (Oral)   Resp (!) 23   Ht 5\' 6"  (1.676 m)   Wt 83 kg   SpO2 97%   BMI 29.54 kg/m  Alert, oriented x 4 3/5 left upper extremity 2/5 left biceps triceps, intrinsics 2/5 Wound is clean, dry, no sign of infection Treatment continuing for sepsis Will need rehab

## 2021-11-21 NOTE — Progress Notes (Signed)
Initial Nutrition Assessment  DOCUMENTATION CODES:   Non-severe (moderate) malnutrition in context of chronic illness  INTERVENTION:   Once gastric Cortrak placement confirmed via x-ray, initiate trickle tube feeds: - Osmolite 1.5 @ 20 ml/hr (480 ml/day)  Trickle tube feeding regimen provides 720 kcal, 30 grams of protein, and 366 ml of H2O (meets 36% of minimum kcal needs and 27% of minimum protein needs).   RD will monitor for ability to advance tube feeding regimen to goal: - Osmolite 1.5 @ 55 ml/hr (1320 ml/day) - ProSource TF 45 ml TID  Recommended tube feeding regimen at goal rate will provide 2100 kcal, 116 grams of protein, and 1006 ml of H2O.   NUTRITION DIAGNOSIS:   Moderate Malnutrition related to chronic illness (COPD, CHF) as evidenced by moderate fat depletion, moderate muscle depletion.  GOAL:   Patient will meet greater than or equal to 90% of their needs  MONITOR:   Diet advancement, Labs, Weight trends, TF tolerance, Skin, I & O's  REASON FOR ASSESSMENT:   Consult Enteral/tube feeding initiation and management (trickle tube feeds)  ASSESSMENT:   71 year old male who presented to the ED on 6/17 with neck pain and stiffness. PMH of CVA, MI, CHF, COPD, atrial fibrillation, osteoarthritis, stroke. Pt found to have C5-C6 spinal cord compression from disc herniation.  06/17 - s/p anterior cervical decompression C5-6 arthrodesis with structural spacer allograft anterior plate fixation I9-4 06/18 - coffee-ground emesis 06/19 - s/p ex-lap, sigmoid resection, end sigmoid colostomy, primary repair of small bowel serosal injury due to diverticular perforation of the sigmoid colon with an adherent loop of small bowel 06/20 - extubated 06/21 - Cortrak placed (x-ray pending)  Discussed pt with RN and during ICU rounds. Cortrak placed this afternoon. X-ray confirmation of gastric placement is pending. Consult received for initiation of trickle tube feeds. Discussed  with Surgery and CCM.  Spoke with pt's wife at bedside. Pt's wife reports that PTA pt had been eating normally. Pt does not eat much but also does not get around a lot. Pt typically eats a big breakfast then snacks throughout the day. He may also have a normal dinner meal.  Pt reports weighing himself daily. Both pt and wife deny recent weight loss and report UBW as 180 lbs. No weight history in chart.  Pt meets criteria for malnutrition based on NFPE findings. Malnutrition is chronic and likely related to increased nutrient needs due to CHF and COPD.  Explained plan to start trickle tube feeds today via Cortrak to start providing some nutrition.  Medications reviewed and include: SSI q 4 hours, IV protonix, amiodarone drip, IV abx IVF: D5 NS @ 10 ml/hr  Labs reviewed: BUN 29, hemoglobin 9.2, platelets 103 CBG's: 95-140 x 24 hours  UOP: 2000 ml x 24 hours I/O's: -1.7 L since admit  NUTRITION - FOCUSED PHYSICAL EXAM:  Flowsheet Row Most Recent Value  Orbital Region Moderate depletion  Upper Arm Region Moderate depletion  Thoracic and Lumbar Region Mild depletion  Buccal Region Mild depletion  Temple Region Moderate depletion  Clavicle Bone Region Moderate depletion  Clavicle and Acromion Bone Region Moderate depletion  Scapular Bone Region Moderate depletion  Dorsal Hand Mild depletion  Patellar Region Moderate depletion  Anterior Thigh Region Moderate depletion  Posterior Calf Region Moderate depletion  Edema (RD Assessment) Mild  [BLE]  Hair Reviewed  Eyes Reviewed  Mouth Reviewed  Skin Reviewed  Nails Reviewed       Diet Order:   Diet Order  Diet NPO time specified Except for: Ice Chips  Diet effective now                   EDUCATION NEEDS:   No education needs have been identified at this time  Skin:  Skin Assessment: Skin Integrity Issues: DTI: perineum Incisions: neck, abdomen  Last BM:  11/16/21 (no documented output from new  colostomy)  Height:   Ht Readings from Last 1 Encounters:  12/02/2021 5\' 6"  (1.676 m)    Weight:   Wt Readings from Last 1 Encounters:  12/02/21 83 kg    BMI:  Body mass index is 29.54 kg/m.  Estimated Nutritional Needs:   Kcal:  2000-2200  Protein:  110-130 grams  Fluid:  >2.0 L    11/20/2021, MS, RD, LDN Inpatient Clinical Dietitian Please see AMiON for contact information.

## 2021-11-21 NOTE — Progress Notes (Signed)
Patient with rhonchi and this RT offered to do CPT at this time, however, patient refused CPT. Breathing treatments given. Will continue to monitor.

## 2021-11-21 NOTE — Evaluation (Signed)
Clinical/Bedside Swallow Evaluation Patient Details  Name: Stephen Whitney MRN: [id number] Date of Birth: 1951/01/02  Today's Date: 11/21/2021 Time: SLP Start Time (ACUTE ONLY): 1510 SLP Stop Time (ACUTE ONLY): 1525 SLP Time Calculation (min) (ACUTE ONLY): 15 min  Past Medical History:  Past Medical History:  Diagnosis Date   A-fib (HCC)    CHF (congestive heart failure) (HCC)    COPD (chronic obstructive pulmonary disease) (HCC)    Dupuytren's contracture of both hands    Dysrhythmia    GAD (generalized anxiety disorder)    Hepatitis C    History of atrial fibrillation 11/11/2021   History of CVA (cerebrovascular accident) 11/15/2021   History of MI (myocardial infarction) 11/02/2021   Hypertension    Osteoarthritis    Stroke [facility])    Past Surgical History:  Past Surgical History:  Procedure Laterality Date   ANTERIOR CERVICAL DECOMP/DISCECTOMY FUSION N/A 11/15/2021   Procedure: ANTERIOR CERVICAL DECOMPRESSION/DISCECTOMY FUSION  Cervical Five - Cervical Six;  Surgeon: Barnett Abu, MD;  Location: [facility] OR;  Service: Neurosurgery;  Laterality: N/A;   HERNIA REPAIR     LAPAROTOMY N/A 11/11/2021   Procedure: EXPLORATORY LAPAROTOMY SIGMOID COLECTOMY COLOSTOMY;  Surgeon: Fritzi Mandes, MD;  Location: [facility] OR;  Service: General;  Laterality: N/A;   TENDON REPAIR Right    Shoulder   HPI:  Patient is a 71 y.o. male with PMH: CVA, MI, heart failure, COPD, osteoarthritis, HLD who presented 11/02/2021 with decreased sensation and weakness of all extremities and found to have C5-6 spinal cord compression from disc herniation and was emergently taken to OR for decompression by neurosurgery. On 6/19 he developed acute respiratory distress requiring NRB. CXR concerning for free air and CT abd/pelvis concerning for bowel perforation. Surgery consulted and patient sent for emergent ex-lap which revealed perforated sigmoid diverticulitis. He was left intubated post-op and transferred to ICU. He was  extubated 6/20; some tachypnea, hypoxemia. Chest x-ray 6/21 reviewed by me shows increased RLL atx and probable effusion    Assessment / Plan / Recommendation  Clinical Impression  Patient presents with clinical s/s of dysphagia as per this bedside/clinical swallow evaluation. SLP tested patient with sips of thin liquid (water) only as surgery only permitting clear liquids at this time. Although patient did not exhibit any overt s/s aspiration or penetration, he did exhibit suspected swallow initiation delay and he is unable to activate his abdominal muscles for producing a productive cough and instead has to use accessory muscles to breath and attempt weak congested, unproductive cough. SLP is recommending continue NPO but allow for PRN ice chips/water sips in small amounts throughout the day as patient would not be able to effectively expel penetrate or aspirate if occuring. Discussed with MD and as patient's progress with returning to adequate amount of PO consumption is expected to take some time, SLP in agreement with plan for temporary non-oral nutrition (Cortrak). SLP will f/u next date. SLP Visit Diagnosis: Dysphagia, unspecified (R13.10)    Aspiration Risk  Mild aspiration risk    Diet Recommendation Ice chips PRN after oral care;NPO;Other (Comment) (water PRN)   Liquid Administration via: Cup;Straw Medication Administration: Via alternative means    Other  Recommendations Oral Care Recommendations: Oral care QID;Staff/trained caregiver to provide oral care;Oral care prior to ice chip/H20    Recommendations for follow up therapy are one component of a multi-disciplinary discharge planning process, led by the attending physician.  Recommendations may be updated based on patient status, additional functional criteria and insurance authorization.  Follow up Recommendations Other (comment) (TBD)      Assistance Recommended at Discharge Frequent or constant Supervision/Assistance   Functional Status Assessment Patient has had a recent decline in their functional status and demonstrates the ability to make significant improvements in function in a reasonable and predictable amount of time.  Frequency and Duration min 2x/week  1 week       Prognosis Prognosis for Safe Diet Advancement: Good      Swallow Study   General Date of Onset: 11/21/21 HPI: Patient is a 71 y.o. male with PMH: CVA, MI, heart failure, COPD, osteoarthritis, HLD who presented 11/28/2021 with decreased sensation and weakness of all extremities and found to have C5-6 spinal cord compression from disc herniation and was emergently taken to OR for decompression by neurosurgery. On 6/19 he developed acute respiratory distress requiring NRB. CXR concerning for free air and CT abd/pelvis concerning for bowel perforation. Surgery consulted and patient sent for emergent ex-lap which revealed perforated sigmoid diverticulitis. He was left intubated post-op and transferred to ICU. He was extubated 6/20; some tachypnea, hypoxemia. Chest x-ray 6/21 reviewed by me shows increased RLL atx and probable effusion Type of Study: Bedside Swallow Evaluation Previous Swallow Assessment: none found Diet Prior to this Study: NPO Temperature Spikes Noted: No Respiratory Status: Nasal cannula History of Recent Intubation: Yes Length of Intubations (days): 2 days Date extubated: 11/20/21 Behavior/Cognition: Alert;Cooperative;Pleasant mood Oral Cavity Assessment: Dry Oral Care Completed by SLP: Yes Oral Cavity - Dentition: Adequate natural dentition Vision: Functional for self-feeding Self-Feeding Abilities: Total assist Patient Positioning: Upright in bed Baseline Vocal Quality: Low vocal intensity Volitional Cough: Weak Volitional Swallow: Able to elicit    Oral/Motor/Sensory Function Overall Oral Motor/Sensory Function: Within functional limits   Ice Chips     Thin Liquid Thin Liquid: Impaired Presentation:  Straw Pharyngeal  Phase Impairments: Suspected delayed Swallow    Nectar Thick     Honey Thick     Puree Puree: Not tested   Solid     Solid: Not tested     Angela Nevin, MA, CCC-SLP Speech Therapy

## 2021-11-22 ENCOUNTER — Inpatient Hospital Stay (HOSPITAL_COMMUNITY): Payer: Medicare HMO

## 2021-11-22 DIAGNOSIS — Z8679 Personal history of other diseases of the circulatory system: Secondary | ICD-10-CM | POA: Diagnosis not present

## 2021-11-22 DIAGNOSIS — K631 Perforation of intestine (nontraumatic): Secondary | ICD-10-CM | POA: Diagnosis not present

## 2021-11-22 DIAGNOSIS — G9520 Unspecified cord compression: Secondary | ICD-10-CM | POA: Diagnosis not present

## 2021-11-22 DIAGNOSIS — J9601 Acute respiratory failure with hypoxia: Secondary | ICD-10-CM | POA: Diagnosis not present

## 2021-11-22 LAB — BASIC METABOLIC PANEL
Anion gap: 11 (ref 5–15)
BUN: 36 mg/dL — ABNORMAL HIGH (ref 8–23)
CO2: 27 mmol/L (ref 22–32)
Calcium: 8.5 mg/dL — ABNORMAL LOW (ref 8.9–10.3)
Chloride: 103 mmol/L (ref 98–111)
Creatinine, Ser: 1.27 mg/dL — ABNORMAL HIGH (ref 0.61–1.24)
GFR, Estimated: >60 mL/min (ref >60)
Glucose, Bld: 137 mg/dL — ABNORMAL HIGH (ref 70–99)
Potassium: 4 mmol/L (ref 3.5–5.1)
Sodium: 141 mmol/L (ref 135–145)

## 2021-11-22 LAB — GLUCOSE, CAPILLARY
Glucose-Capillary: 149 mg/dL — ABNORMAL HIGH (ref 70–99)
Glucose-Capillary: 138 mg/dL — ABNORMAL HIGH (ref 70–99)
Glucose-Capillary: 175 mg/dL — ABNORMAL HIGH (ref 70–99)
Glucose-Capillary: 178 mg/dL — ABNORMAL HIGH (ref 70–99)
Glucose-Capillary: 156 mg/dL — ABNORMAL HIGH (ref 70–99)
Glucose-Capillary: 188 mg/dL — ABNORMAL HIGH (ref 70–99)

## 2021-11-22 LAB — CBC
HCT: 28.8 % — ABNORMAL LOW (ref 39.0–52.0)
Hemoglobin: 9.7 g/dL — ABNORMAL LOW (ref 13.0–17.0)
MCH: 31.7 pg (ref 26.0–34.0)
MCHC: 33.7 g/dL (ref 30.0–36.0)
MCV: 94.1 fL (ref 80.0–100.0)
Platelets: 113 10*3/uL — ABNORMAL LOW (ref 150–400)
RBC: 3.06 MIL/uL — ABNORMAL LOW (ref 4.22–5.81)
RDW: 12.1 % (ref 11.5–15.5)
WBC: 10.5 10*3/uL (ref 4.0–10.5)
nRBC: 0 % (ref 0.0–0.2)

## 2021-11-22 LAB — CULTURE, RESPIRATORY W GRAM STAIN
Culture: NORMAL
Gram Stain: NONE SEEN

## 2021-11-22 LAB — PHOSPHORUS: Phosphorus: 4.5 mg/dL (ref 2.5–4.6)

## 2021-11-22 LAB — APTT
aPTT: 42 seconds — ABNORMAL HIGH (ref 24–36)
aPTT: 48 seconds — ABNORMAL HIGH (ref 24–36)

## 2021-11-22 LAB — HEPARIN LEVEL (UNFRACTIONATED): Heparin Unfractionated: 0.38 [IU]/mL (ref 0.30–0.70)

## 2021-11-22 LAB — MAGNESIUM: Magnesium: 1.9 mg/dL (ref 1.7–2.4)

## 2021-11-22 MED ORDER — POLYVINYL ALCOHOL 1.4 % OP SOLN
1.0000 [drp] | OPHTHALMIC | Status: DC | PRN
  Administered 2021-11-24: 1 [drp] via OPHTHALMIC
  Filled 2021-11-22: qty 15

## 2021-11-22 MED ORDER — HEPARIN (PORCINE) 25000 UT/250ML-% IV SOLN
1600.0000 [IU]/h | INTRAVENOUS | Status: DC
  Administered 2021-11-22: 1150 [IU]/h via INTRAVENOUS
  Administered 2021-11-23 – 2021-11-26 (×5): 1500 [IU]/h via INTRAVENOUS
  Filled 2021-11-22 (×6): qty 250

## 2021-11-22 MED ORDER — MAGNESIUM SULFATE IN D5W 1-5 GM/100ML-% IV SOLN
1.0000 g | Freq: Once | INTRAVENOUS | Status: AC
  Administered 2021-11-22: 1 g via INTRAVENOUS
  Filled 2021-11-22: qty 100

## 2021-11-22 MED ORDER — PANTOPRAZOLE 2 MG/ML SUSPENSION
40.0000 mg | Freq: Every day | ORAL | Status: DC
Start: 2021-11-22 — End: 2021-11-23
  Administered 2021-11-22: 40 mg
  Filled 2021-11-22: qty 20

## 2021-11-22 MED ORDER — METOPROLOL TARTRATE 25 MG/10 ML ORAL SUSPENSION
25.0000 mg | Freq: Two times a day (BID) | ORAL | Status: DC
  Administered 2021-11-22 – 2021-11-25 (×8): 25 mg
  Filled 2021-11-22 (×8): qty 10

## 2021-11-22 MED ORDER — DOXAZOSIN MESYLATE 2 MG PO TABS
2.0000 mg | ORAL_TABLET | Freq: Every day | ORAL | Status: DC
  Administered 2021-11-22: 2 mg
  Filled 2021-11-22 (×3): qty 1

## 2021-11-22 NOTE — Progress Notes (Signed)
ANTICOAGULATION CONSULT NOTE - Initial Consult  Pharmacy Consult for Heparin Indication: atrial fibrillation  Allergies  Allergen Reactions   Unasyn [Ampicillin-Sulbactam Sodium] Rash   Zosyn [Piperacillin Sod-Tazobactam So] Rash    Patient Measurements: Height: 5\' 6"  (167.6 cm) Weight: 89.7 kg (197 lb 12 oz) IBW/kg (Calculated) : 63.8 Heparin Dosing Weight: 82.7 kg   Vital Signs: Temp: 98.1 F (36.7 C) (06/22 1500) Temp Source: Oral (06/22 1500) BP: 100/76 (06/22 1555) Pulse Rate: 108 (06/22 1545)  Labs: Recent Labs    11/09/2021 1649 11/12/2021 1904 11/20/21 0514 11/20/21 1839 11/21/21 0459 11/22/21 0122 11/22/21 1537  HGB  --    < > 9.1* 8.5* 9.2* 9.7*  --   HCT  --    < > 26.3* 25.0* 26.7* 28.8*  --   PLT  --   --  99*  --  103* 113*  --   APTT 29  --   --   --   --   --  42*  LABPROT 15.3*  --   --   --   --   --   --   INR 1.2  --   --   --   --   --   --   HEPARINUNFRC >1.10*  --   --   --   --   --   --   CREATININE  --   --  1.11  --  1.18 1.27*  --    < > = values in this interval not displayed.     Estimated Creatinine Clearance: 57.6 mL/min (A) (by C-G formula based on SCr of 1.27 mg/dL (H)).   Medical History: Past Medical History:  Diagnosis Date   A-fib [facility])    CHF (congestive heart failure) (HCC)    COPD (chronic obstructive pulmonary disease) (HCC)    Dupuytren's contracture of both hands    Dysrhythmia    GAD (generalized anxiety disorder)    Hepatitis C    History of atrial fibrillation 11/06/2021   History of CVA (cerebrovascular accident) 11/28/2021   History of MI (myocardial infarction) 11/28/2021   Hypertension    Osteoarthritis    Stroke [facility])      Assessment: 71 yo male presented on 11/02/2021 with C5-6 compression requiring emergent decompression then on 11/03/2021 found to have perforated sigmoid diverticulitis s/p exlap. Patient is on apixaban prior to admission for Afib and received Kcentra x2 for surgeries (6/17, 6/19).   Last dose apixaban 6/19 AM. Pharmacy consulted to start heparin infusion for afib with no bolus. Hgb 9.7. Plt 113. Slight oozing noted on dressing change this AM.   aPTT 42 secs which is subtherapeutic  Goal of Therapy:  Heparin level 0.3-0.5 units/ml APTT 66 - 85 secs Monitor platelets by anticoagulation protocol: Yes   Plan:  Increase heparin 1300 units/hr No bolus per surgery and CCM  Check 6 hr aPTT  Transition to monitoring via heparin level when aPTT and heparin level correlating due to previous apixaban falsely elevating heparin level  Monitor heparin level, aPTT, CBC and s/s of bleeding daily  Follow up transition to oral anticoagulant   7/19, PharmD, [facility] Clinical Pharmacist Please see AMION for all Pharmacists' Contact Phone Numbers 11/22/2021, 4:13 PM

## 2021-11-22 NOTE — Progress Notes (Signed)
NAME:  Stephen Whitney, MRN:  [id number], DOB:  Feb 27, 1951, LOS: 5 ADMISSION DATE:  11/25/2021, CONSULTATION DATE:  11/22/21 REFERRING MD: Freida Busman , CHIEF COMPLAINT:  Perforated sigmoid diverticulitis   History of Present Illness:  Stephen Whitney is a 71 y.o. M with PMH significant for Atrial fibrillation, CAD and MI, CHF, COPD, CVA who presented 6/17 with numbness, tingling and weakness in his arms and leg and pain of the back and neck.  He was diagnosed with C5-C6 compression and required emergent decompression.  On 6/19, pt developed acute respiratory distress requiring non-rebreather.  CXR was concerning for free air and CT abd/pelvis concerning for bowel perforation.  Surgery was consulted and pt taken to the OR for emergent ex-lap which revealed perforated sigmoid diverticulitis.  He was left intubated post-op and transferred to the ICU.  Pertinent  Medical History   has a past medical history of A-fib (HCC), CHF (congestive heart failure) (HCC), COPD (chronic obstructive pulmonary disease) (HCC), Dupuytren's contracture of both hands, Dysrhythmia, GAD (generalized anxiety disorder), Hepatitis C, History of atrial fibrillation (11/02/2021), History of CVA (cerebrovascular accident) (11/06/2021), History of MI (myocardial infarction) (11/03/2021), Hypertension, Osteoarthritis, and Stroke (HCC).   Significant Hospital Events: Including procedures, antibiotic start and stop dates in addition to other pertinent events   6/17 admitted with C5-C6 compression requiring emergent decompression 6/19 acute respiratory distress, free air on CT abd/pelvis, found to have perforated sigmoid diverticulitis > emergent OR 6/19 6/19 rash in the OR with Zosyn, had also received Unasyn.  Changed to Cipro/Flagyl on 6/20 Extubated 6/20.  Some subsequent tachypnea, A-fib/RVR  Interim History / Subjective:  4 L/min nasal cannula Remains in atrial fibrillation, heart rate 120-130, now on amiodarone infusion.   Metoprolol added 6/21 Dosing Lasix daily, SCr 1.18 > 1.27 Plt 113, Hgb 9.7 I/O -2 L total    Objective   Blood pressure 116/79, pulse (!) 115, temperature 98.5 F (36.9 C), temperature source Oral, resp. rate (!) 22, height 5\' 6"  (1.676 m), weight 89.7 kg, SpO2 95 %.        Intake/Output Summary (Last 24 hours) at 11/22/2021 0825 Last data filed at 11/22/2021 0700 Gross per 24 hour  Intake 1662.5 ml  Output 2900 ml  Net -1237.5 ml   Filed Weights   11/24/2021 1310 11/22/21 0420 11/22/21 0800  Weight: 83 kg 89.7 kg 89.7 kg   General: Critically ill gentleman, no distress, laying in bed HEENT: Weak voice, no secretions Neuro: Awake and interacting, following commands.  Able to move his upper extremities, not the lowers CV: Irregularly irregular, 125, distant, no murmur PULM: Decreased both bases, no wheezes, few scattered crackles GI: slightly more distended today, ostomy and wound look good, no purulence. No ostomy output Extremities: Trace lower extremity edema Skin: some rash, faint, on skin from abd dressing tape   Chest x-ray 6/22 reviewed by me, slight increase in right pleural effusion, mild bibasilar atelectasis  Resolved Hospital Problem list     Assessment & Plan:   Acute Hypoxic and Hypercarbic Respiratory Failure secondary to possible aspiration Pneumonia and underlying COPD.  Consider contribution acute diastolic CHF in the setting of A-fib.   Right pleural effusion -Continue to push pulmonary hygiene -Ensure adequate pain control to avoid abdominal noncompliance -Rate control and diuresis as able to tolerate -Following chest x-ray.  Will assess right pleural space with ultrasound today, consider therapeutic thoracentesis if enough fluid to obtain to be clinically beneficial -BD changed to Xopenex on 6/21 given his atrial fibrillation  Shock, likely septic shock + possible contribution sedating meds Pneumoperitoneum secondary to perforated Sigmoid Diverticula  with presumed peritonitis  -Pressors off -Post emergent ex lap and ostomy creation for sigmoid perf and peritonitis.  Plan for 5 days total of antibiotics, currently Cipro/Flagyl, changed 6/20 given rash on Zosyn/Unasyn  Atrial Fibrillation with RVR postoperatively and postextubation and associated acute on chronic HFpEF -Continue amiodarone infusion -Increase metoprolol to 25 mg twice daily 6/22 -Discussed with CTS, okay to start heparin infusion without a bolus today 6/22 -Dosing diuretics based on clinical status and renal function, blood pressure.  Lasix 20 mg x 1 on 6/22  Erythematous Rash Most likely allergic, unclear etiology, on trunk, groin and face, sparing extremities -Received Benadryl 50mg , solumedrol, completed  Recent C5-C6 Compression with recent emergent decompression -Appreciate management by neurosurgery.    AKI, at risk but ruled out S Cr 1.16 > 1.29 > 1.11 -Decreased maintenance IV fluids 6/20, bolus if indicated -Continue to follow urine output, BMP -Renal dose medications and ensure adequate renal perfusion -Intermittent Lasix, dosing daily   Hyperglycemia, contribution steroids -Any CBG, sliding scale insulin as per protocol  Stage I pressure injury, not POA noted due to crit illness and immobility. -Following WOC recommendations  Nutrition -Trickle TF as per CCS recommendations -Okay for sips, clears p.o.   Best Practice (right click and "Reselect all SmartList Selections" daily)   Diet/type: tubefeeds and clear liquids DVT prophylaxis: systemic heparin GI prophylaxis: PPI Lines: N/A Foley:  Yes, and it is still needed Code Status:  full code Last date of multidisciplinary goals of care discussion pending  Labs   CBC: Recent Labs  Lab 11/24/2021 0756 11/18/21 0315 11/18/21 1753 11/05/2021 0337 11/20/2021 1904 11/16/2021 2142 11/20/21 0514 11/20/21 1839 11/21/21 0459 11/22/21 0122  WBC 10.8*   < > 16.9* 14.4*  --   --  8.4  --  9.3 10.5   NEUTROABS 8.1*  --   --   --   --   --   --   --   --   --   HGB 11.7*   < > 11.1* 10.8*   < > 9.2* 9.1* 8.5* 9.2* 9.7*  HCT 33.1*   < > 30.8* 31.2*   < > 27.0* 26.3* 25.0* 26.7* 28.8*  MCV 91.4   < > 90.9 93.4  --   --  92.3  --  92.7 94.1  PLT 144*   < > 124* 116*  --   --  99*  --  103* 113*   < > = values in this interval not displayed.    Basic Metabolic Panel: Recent Labs  Lab 11/18/21 0315 11/09/2021 0337 11/03/2021 1904 11/03/2021 2142 11/20/21 0514 11/20/21 1839 11/21/21 0459 11/22/21 0122  NA 135 133*   < > 136 136 138 138 141  K 4.0 4.4   < > 4.9 4.3 4.2 4.2 4.0  CL 100 104  --   --  107  --  105 103  CO2 24 21*  --   --  24  --  24 27  GLUCOSE 169* 130*  --   --  197*  --  122* 137*  BUN 17 33*  --   --  23  --  29* 36*  CREATININE 1.20 1.29*  --   --  1.11  --  1.18 1.27*  CALCIUM 8.8* 8.5*  --   --  8.1*  --  8.5* 8.5*  MG  --   --   --   --   --   --  2.1 1.9  PHOS  --   --   --   --   --   --   --  4.5   < > = values in this interval not displayed.   GFR: Estimated Creatinine Clearance: 57.6 mL/min (A) (by C-G formula based on SCr of 1.27 mg/dL (H)). Recent Labs  Lab 11/16/2021 0337 11/11/2021 1649 11/20/21 0514 11/21/21 0459 11/22/21 0122  WBC 14.4*  --  8.4 9.3 10.5  LATICACIDVEN  --  2.3*  --   --   --     Liver Function Tests: Recent Labs  Lab 11/28/2021 0756 11/18/21 0315 11/22/2021 0337 11/20/21 0514 11/21/21 0459  AST 14* 19 25 38 38  ALT 15 19 15 17 23   ALKPHOS 30* 33* 26* 18* 23*  BILITOT 0.5 1.1 1.1 0.9 0.9  PROT 4.4* 5.3* 4.9* 4.5* 4.6*  ALBUMIN 2.8* 3.1* 2.8* 2.5* 2.4*   No results for input(s): "LIPASE", "AMYLASE" in the last 168 hours. No results for input(s): "AMMONIA" in the last 168 hours.  ABG    Component Value Date/Time   PHART 7.459 (H) 11/20/2021 1839   PCO2ART 34.7 11/20/2021 1839   PO2ART 51 (L) 11/20/2021 1839   HCO3 24.7 11/20/2021 1839   TCO2 26 11/20/2021 1839   ACIDBASEDEF 1.0 11/09/2021 2142   O2SAT 89 11/20/2021  1839     Coagulation Profile: Recent Labs  Lab 11/14/2021 1649  INR 1.2    Cardiac Enzymes: No results for input(s): "CKTOTAL", "CKMB", "CKMBINDEX", "TROPONINI" in the last 168 hours.  HbA1C: Hgb A1c MFr Bld  Date/Time Value Ref Range Status  11/20/2021 05:14 AM 5.4 4.8 - 5.6 % Final    Comment:    (NOTE) Pre diabetes:          5.7%-6.4%  Diabetes:              >6.4%  Glycemic control for   <7.0% adults with diabetes     CBG: Recent Labs  Lab 11/21/21 1516 11/21/21 1919 11/21/21 2334 11/22/21 0329 11/22/21 0805  GLUCAP 95 120* 160* 149* 138*     Critical care time: 31 minutes    CRITICAL CARE Performed by: 11/24/21   Total critical care time: 31 minutes  Critical care time was exclusive of separately billable procedures and treating other patients.  Critical care was necessary to treat or prevent imminent or life-threatening deterioration.  Critical care was time spent personally by me on the following activities: development of treatment plan with patient and/or surrogate as well as nursing, discussions with consultants, evaluation of patient's response to treatment, examination of patient, obtaining history from patient or surrogate, ordering and performing treatments and interventions, ordering and review of laboratory studies, ordering and review of radiographic studies, pulse oximetry and re-evaluation of patient's condition.   Leslye Peer, MD, PhD 11/22/2021, 8:25 AM [facility] [phone] or if no answer before 7:00PM call [phone] For any issues after 7:00PM please call eLink [phone]

## 2021-11-22 NOTE — TOC Initial Note (Signed)
Transition of Care [facility]) - Initial/Assessment Note    Patient Details  Name: Stephen Whitney MRN: [id number] Date of Birth: 09-04-1950  Transition of Care [facility]) CM/SW Contact:    Ralene Bathe, LCSWA Phone Number: 11/22/2021, 1:08 PM  Clinical Narrative:                CSW received consult for SNF placement as patient is not longer a candidate for CIR.  The patient was tired from participating with PT, but informed CSW that he will need to discuss discharge plans with his wife prior to making a decision.    CSW will continue to follow.  Expected Discharge Plan: Skilled Nursing Facility Barriers to Discharge: Continued Medical Work up   Patient Goals and CMS Choice   CMS Medicare.gov Compare Post Acute Care list provided to:: Patient Choice offered to / list presented to : Patient  Expected Discharge Plan and Services Expected Discharge Plan: Skilled Nursing Facility       Living arrangements for the past 2 months: Single Family Home                                      Prior Living Arrangements/Services Living arrangements for the past 2 months: Single Family Home Lives with:: Spouse Patient language and need for interpreter reviewed:: Yes Do you feel safe going back to the place where you live?: Yes      Need for Family Participation in Patient Care: Yes (Comment) Care giver support system in place?: Yes (comment)   Criminal Activity/Legal Involvement Pertinent to Current Situation/Hospitalization: No - Comment as needed  Activities of Daily Living      Permission Sought/Granted   Permission granted to share information with : Yes, Verbal Permission Granted     Permission granted to share info w AGENCY: SNFs        Emotional Assessment Appearance:: Appears stated age Attitude/Demeanor/Rapport: Lethargic Affect (typically observed): Appropriate Orientation: : Oriented to Self, Oriented to Place, Oriented to  Time, Oriented to Situation Alcohol /  Substance Use: Not Applicable Psych Involvement: No (comment)  Admission diagnosis:  Spinal cord compression (HCC) [G95.20] 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] Patient Active Problem List   Diagnosis Date Noted   Malnutrition of moderate degree 11/21/2021   Acute respiratory failure with hypoxia and hypercapnia (HCC)    Perforated bowel (HCC)    Spinal cord compression (HCC) 11/09/2021   History of atrial fibrillation 11/20/2021   History of CVA (cerebrovascular accident) 11/28/2021   History of MI (myocardial infarction) 11/05/2021   Osteoarthritis 11/21/2021   CHF (congestive heart failure) (HCC) 11/24/2021   Hypertension 11/23/2021   PCP:  Pcp, No Pharmacy:   [id number] - [place], [place] - [address] [place] [address] Phone: [phone] Fax: [phone]     Social Determinants of Health (SDOH) Interventions    Readmission Risk Interventions     No data to display

## 2021-11-22 NOTE — Progress Notes (Signed)
ANTICOAGULATION CONSULT NOTE - Initial Consult  Pharmacy Consult for Heparin Indication: atrial fibrillation  No Known Allergies  Patient Measurements: Height: 5\' 6"  (167.6 cm) Weight: 89.7 kg (197 lb 12 oz) IBW/kg (Calculated) : 63.8 Heparin Dosing Weight: 82.7 kg   Vital Signs: Temp: 98.5 F (36.9 C) (06/22 0744) Temp Source: Oral (06/22 0744) BP: 116/79 (06/22 0700) Pulse Rate: 115 (06/22 0700)  Labs: Recent Labs    2021/12/10 1649 10-Dec-2021 1904 11/20/21 0514 11/20/21 1839 11/21/21 0459 11/22/21 0122  HGB  --    < > 9.1* 8.5* 9.2* 9.7*  HCT  --    < > 26.3* 25.0* 26.7* 28.8*  PLT  --   --  99*  --  103* 113*  APTT 29  --   --   --   --   --   LABPROT 15.3*  --   --   --   --   --   INR 1.2  --   --   --   --   --   HEPARINUNFRC >1.10*  --   --   --   --   --   CREATININE  --   --  1.11  --  1.18 1.27*   < > = values in this interval not displayed.    Estimated Creatinine Clearance: 57.6 mL/min (A) (by C-G formula based on SCr of 1.27 mg/dL (H)).   Medical History: Past Medical History:  Diagnosis Date   A-fib [facility])    CHF (congestive heart failure) (HCC)    COPD (chronic obstructive pulmonary disease) (HCC)    Dupuytren's contracture of both hands    Dysrhythmia    GAD (generalized anxiety disorder)    Hepatitis C    History of atrial fibrillation 11/08/2021   History of CVA (cerebrovascular accident) 11/15/2021   History of MI (myocardial infarction) 11/09/2021   Hypertension    Osteoarthritis    Stroke [facility])      Assessment: 71 yo male presented on 11/16/2021 with C5-6 compression requiring emergent decompression then on December 10, 2021 found to have perforated sigmoid diverticulitis s/p exlap. Patient is on apixaban prior to admission for Afib and received Kcentra x2 for surgeries (6/17, 6/19).  Last dose apixaban 6/19 AM. Pharmacy consulted to start heparin infusion for afib with no bolus. Hgb 9.7. Plt 113. Slight oozing noted on dressing change this AM.     Goal of Therapy:  Heparin level 0.3-0.5 units/ml Monitor platelets by anticoagulation protocol: Yes   Plan:  Start heparin 1150 units/hr No bolus per surgery and CCM  Check 6 hr heparin level and aPTT  Transition to monitoring via heparin level when aPTT and heparin level correlating due to previous apixaban falsely elevating heparin level  Monitor heparin level, aPTT, CBC and s/s of bleeding daily  Follow up transition to oral anticoagulant    7/19, PharmD, BCPS Clinical Pharmacist 11/22/2021 8:21 AM

## 2021-11-22 NOTE — Progress Notes (Signed)
[facility] Progress Note  3 Days Post-Op  Subjective: CC:  Cc is neck pain and feeling thirsty Denies abd pain or nausea, On osmolite at 57mL/hr via cortrak. No ostomy output  Objective: Vital signs in last 24 hours: Temp:  [97.7 F (36.5 C)-99.6 F (37.6 C)] 97.7 F (36.5 C) (06/22 0405) Pulse Rate:  [105-187] 115 (06/22 0700) Resp:  [16-29] 22 (06/22 0700) BP: (84-121)/(59-79) 116/79 (06/22 0700) SpO2:  [81 %-100 %] 95 % (06/22 0744) Arterial Line BP: (97-132)/(51-85) 113/53 (06/21 1430) Weight:  [89.7 kg] 89.7 kg (06/22 0420) Last BM Date : 11/16/21  Intake/Output from previous day: 06/21 0701 - 06/22 0700 In: 1672.5 [P.O.:180; I.V.:528.5; NG/GT:271.3; IV Piggyback:692.7] Out: 3000 [Urine:3000] Intake/Output this shift: No intake/output data recorded.  PE: Gen: ill appearing Card:  irregularly irregular, afib w/ rate 129 bpm Pulm: slightly labored respirations  Abd: Soft, slight interval increase in abdominal distention, overal non-tender, Midline incision loosely closed with staples, minimal SS drainage, some skin irrigation from medipore tape, Ostomy viable, no gas/stool in pouch.  GU: interval removal of foley. Per RN has not been able to void independently Skin: warm and dry Psych: A&Ox3 Lab Results:  Recent Labs    11/21/21 0459 11/22/21 0122  WBC 9.3 10.5  HGB 9.2* 9.7*  HCT 26.7* 28.8*  PLT 103* 113*   BMET Recent Labs    11/21/21 0459 11/22/21 0122  NA 138 141  K 4.2 4.0  CL 105 103  CO2 24 27  GLUCOSE 122* 137*  BUN 29* 36*  CREATININE 1.18 1.27*  CALCIUM 8.5* 8.5*   PT/INR Recent Labs    11/18/2021 1649  LABPROT 15.3*  INR 1.2   CMP     Component Value Date/Time   NA 141 11/22/2021 0122   K 4.0 11/22/2021 0122   CL 103 11/22/2021 0122   CO2 27 11/22/2021 0122   GLUCOSE 137 (H) 11/22/2021 0122   BUN 36 (H) 11/22/2021 0122   CREATININE 1.27 (H) 11/22/2021 0122   CALCIUM 8.5 (L) 11/22/2021 0122   PROT 4.6 (L)  11/21/2021 0459   ALBUMIN 2.4 (L) 11/21/2021 0459   AST 38 11/21/2021 0459   ALT 23 11/21/2021 0459   ALKPHOS 23 (L) 11/21/2021 0459   BILITOT 0.9 11/21/2021 0459   GFRNONAA >60 11/22/2021 0122   Lipase  No results found for: "LIPASE"     Studies/Results: DG Chest Port 1 View  Result Date: 11/22/2021 CLINICAL DATA:  [id number] right pleural effusion EXAM: PORTABLE CHEST 1 VIEW COMPARISON:  Chest x-ray from yesterday FINDINGS: There is a moderate right pleural effusion and has mildly increased in the interim. There has been interval insertion of a feeding tube with its tip below the GE junction. Cardiomegaly. Minor atelectasis at the left lung base. Likely superimposed atelectasis at the right lung base. IMPRESSION: There has been interval insertion of a feeding tube with its tip likely in the body of the stomach. Moderate right pleural effusion and has mildly increased in the interim. Minor left atelectasis. Electronically Signed   By: Marjo Bicker M.D.   On: 11/22/2021 07:33   DG Abd Portable 1V  Result Date: 11/21/2021 CLINICAL DATA:  Feeding tube placement. EXAM: PORTABLE ABDOMEN - 1 VIEW COMPARISON:  None Available. FINDINGS: The bowel gas pattern is normal. Feeding tube tip is seen in expected position of distal stomach. No radio-opaque calculi or other significant radiographic abnormality are seen. IMPRESSION: Feeding tube tip seen in expected position of distal stomach. Electronically  Signed   By: Lupita Raider M.D.   On: 11/21/2021 16:14   DG Chest Port 1 View  Result Date: 11/21/2021 CLINICAL DATA:  Respiratory failure in a 71 year old male. EXAM: PORTABLE CHEST 1 VIEW COMPARISON:  November 20, 2021. FINDINGS: Cardiomediastinal contours and hilar structures are stable. Increasing opacity at the RIGHT lung base along with blunting of RIGHT costodiaphragmatic sulcus, RIGHT hemidiaphragm now obscured. Streaky linear opacities in the LEFT lower chest. No pneumothorax. On limited assessment  there is no acute skeletal finding. IMPRESSION: 1. Increasing opacity at the RIGHT lung base likely related to increasing pleural fluid superimposed on known RIGHT lower lobe pneumonia or aspiration change. 2. Streaky linear opacities in the LEFT lower chest, likely atelectasis. Electronically Signed   By: Donzetta Kohut M.D.   On: 11/21/2021 07:21    Anti-infectives: Anti-infectives (From admission, onward)    Start     Dose/Rate Route Frequency Ordered Stop   11/20/21 1400  metroNIDAZOLE (FLAGYL) IVPB 500 mg  Status:  Discontinued        500 mg 100 mL/hr over 60 Minutes Intravenous Every 12 hours 11/20/21 0755 11/20/21 0834   11/20/21 1400  ciprofloxacin (CIPRO) IVPB 400 mg  Status:  Discontinued        400 mg 200 mL/hr over 60 Minutes Intravenous Every 12 hours 11/20/21 0755 11/20/21 0834   11/20/21 1000  ciprofloxacin (CIPRO) IVPB 400 mg        400 mg 200 mL/hr over 60 Minutes Intravenous Every 12 hours 11/20/21 0834     11/20/21 1000  metroNIDAZOLE (FLAGYL) IVPB 500 mg        500 mg 100 mL/hr over 60 Minutes Intravenous Every 12 hours 11/20/21 0834     11/07/2021 1745  piperacillin-tazobactam (ZOSYN) IVPB 3.375 g  Status:  Discontinued        3.375 g 12.5 mL/hr over 240 Minutes Intravenous Every 8 hours 11/14/2021 1651 11/20/21 0755   11/10/2021 1015  ampicillin-sulbactam (UNASYN) 1.5 g in sodium chloride 0.9 % 100 mL IVPB  Status:  Discontinued        1.5 g 200 mL/hr over 30 Minutes Intravenous Every 6 hours 11/18/2021 0918 11/07/2021 1651   11/29/2021 2030  ceFAZolin (ANCEF) IVPB 2g/100 mL premix        2 g 200 mL/hr over 30 Minutes Intravenous Every 8 hours 11/28/2021 1944 11/18/21 0507   11/05/2021 1312  ceFAZolin (ANCEF) 2-4 GM/100ML-% IVPB       Note to Pharmacy: Crissie Sickles: cabinet override      11/03/2021 1312 11/18/21 0129        Assessment/Plan Pneumoperitoneum Acute sigmoid diverticulitis with perforation  S/P  Exploratory laparotomy, sigmoid resection, end sigmoid colostomy,  primary repair of small bowel serosal injury 11/22/2021 Dr. Sophronia Simas - POD#3, off of pressors, on IV abx - Ok to start sips of clears when cleared for swallowing - WOC RN following for new ostomy - daily dry dressing changes to midline wound and as needed for saturation   FEN: NPO - ok for CLD when cleared by SLP, IVF, currently on TF at 20cc/hr, would not advance today given abdominal distention and no bowel function. ID: Zosyn 6/19, cipro/flagyl 6/20 >> ok to stop abx after 4 days post-op (6/23 at 2359) VTE: SCD's, in afib with RVR - platelets are up slightly to 113K, ok to trial hep gtt without bolus from surgical perspective Foley: in place for strict I&Os, and may need to stay in place for  neurogenic bladder. Per NS   Acute Respiratory failure Aspiration PNA COPD Quadriplegia due to Herniated nucleus pulposis at C5-C6 s/p anterior cervical decompression and fixation 6/17 Dr. Danielle Dess AKI  Afib on Eliquis at home (held) Heart Failure with preserved EF   LOS: 5 days   I reviewed nursing notes, Consultant CCM, NS notes, last 24 h vitals and pain scores, last 48 h intake and output, last 24 h labs and trends, and last 24 h imaging results.   Adam Phenix MD [facility] Surgery Please see Amion for pager number during day hours 7:00am-4:30pm

## 2021-11-22 NOTE — Progress Notes (Signed)
CCM and gen sx aware of pt's abdominal distention

## 2021-11-22 NOTE — Progress Notes (Signed)
RT NT suctioned pt x1 and got a moderate amount of tan/white secretions. Pt tolerated fairly well. Vitals stable throughout.

## 2021-11-22 NOTE — Progress Notes (Signed)
Speech Language Pathology Treatment: Dysphagia  Patient Details Name: Stephen Whitney MRN: [id number] DOB: December 14, 1950 Today's Date: 11/22/2021 Time: 0950-1005 SLP Time Calculation (min) (ACUTE ONLY): 15 min  Assessment / Plan / Recommendation Clinical Impression  Patient seen by SLP for skilled treatment session focused on dysphagia goals. Patient was awake and alert and achieving strong voicing today. Per RN he has been having some improvement in ability to cough as well. Patient requested a "Sprite". SLP assessed patient's toleration of thin liquids with lemon-lime soda. Swallow initiation appeared timely, no changes in voice or vitals and no overt s/s aspiration or penetration. At this time, SLP is recommending to advance from NPO with PRN water/ice to clear liquids. Patient and RN informed of change and MD consulted who was in agreement. SLP will continue following patient for diet toleration. SLP will consult with surgery prior to any attempts to advance beyond clear liquids.   HPI HPI: Patient is a 71 y.o. male with PMH: CVA, MI, heart failure, COPD, osteoarthritis, HLD who presented 17-Dec-2021 with decreased sensation and weakness of all extremities and found to have C5-6 spinal cord compression from disc herniation and was emergently taken to OR for decompression by neurosurgery. On 6/19 he developed acute respiratory distress requiring NRB. CXR concerning for free air and CT abd/pelvis concerning for bowel perforation. Surgery consulted and patient sent for emergent ex-lap which revealed perforated sigmoid diverticulitis. He was left intubated post-op and transferred to ICU. He was extubated 6/20; some tachypnea, hypoxemia. Chest x-ray 6/21 reviewed by me shows increased RLL atx and probable effusion      SLP Plan  Continue with current plan of care      Recommendations for follow up therapy are one component of a multi-disciplinary discharge planning process, led by the attending physician.   Recommendations may be updated based on patient status, additional functional criteria and insurance authorization.    Recommendations  Diet recommendations: Thin liquid Liquids provided via: Straw;Cup Medication Administration: Via alternative means Supervision: Trained caregiver to feed patient Compensations: Slow rate;Small sips/bites Postural Changes and/or Swallow Maneuvers: Seated upright 90 degrees                Oral Care Recommendations: Oral care QID;Staff/trained caregiver to provide oral care Follow Up Recommendations: Follow physician's recommendations for discharge plan and follow up therapies Assistance recommended at discharge: Frequent or constant Supervision/Assistance SLP Visit Diagnosis: Dysphagia, unspecified (R13.10) Plan: Continue with current plan of care         Angela Nevin, MA, CCC-SLP Speech Therapy

## 2021-11-23 ENCOUNTER — Inpatient Hospital Stay (HOSPITAL_COMMUNITY): Payer: Medicare HMO

## 2021-11-23 ENCOUNTER — Inpatient Hospital Stay: Payer: Self-pay

## 2021-11-23 DIAGNOSIS — J9601 Acute respiratory failure with hypoxia: Secondary | ICD-10-CM | POA: Diagnosis not present

## 2021-11-23 DIAGNOSIS — K631 Perforation of intestine (nontraumatic): Secondary | ICD-10-CM | POA: Diagnosis not present

## 2021-11-23 DIAGNOSIS — Z8679 Personal history of other diseases of the circulatory system: Secondary | ICD-10-CM | POA: Diagnosis not present

## 2021-11-23 DIAGNOSIS — J9602 Acute respiratory failure with hypercapnia: Secondary | ICD-10-CM | POA: Diagnosis not present

## 2021-11-23 LAB — HEPARIN LEVEL (UNFRACTIONATED): Heparin Unfractionated: 0.68 IU/mL (ref 0.30–0.70)

## 2021-11-23 LAB — BASIC METABOLIC PANEL
Anion gap: 12 (ref 5–15)
BUN: 29 mg/dL — ABNORMAL HIGH (ref 8–23)
CO2: 26 mmol/L (ref 22–32)
Calcium: 8.4 mg/dL — ABNORMAL LOW (ref 8.9–10.3)
Chloride: 101 mmol/L (ref 98–111)
Creatinine, Ser: 1.08 mg/dL (ref 0.61–1.24)
GFR, Estimated: >60 mL/min (ref >60)
Glucose, Bld: 159 mg/dL — ABNORMAL HIGH (ref 70–99)
Potassium: 4.1 mmol/L (ref 3.5–5.1)
Sodium: 139 mmol/L (ref 135–145)

## 2021-11-23 LAB — CBC
HCT: 29.3 % — ABNORMAL LOW (ref 39.0–52.0)
Hemoglobin: 9.8 g/dL — ABNORMAL LOW (ref 13.0–17.0)
MCH: 31.3 pg (ref 26.0–34.0)
MCHC: 33.4 g/dL (ref 30.0–36.0)
MCV: 93.6 fL (ref 80.0–100.0)
Platelets: 116 10*3/uL — ABNORMAL LOW (ref 150–400)
RBC: 3.13 MIL/uL — ABNORMAL LOW (ref 4.22–5.81)
RDW: 12.1 % (ref 11.5–15.5)
WBC: 12.7 10*3/uL — ABNORMAL HIGH (ref 4.0–10.5)
nRBC: 0 % (ref 0.0–0.2)

## 2021-11-23 LAB — GLUCOSE, CAPILLARY
Glucose-Capillary: 155 mg/dL — ABNORMAL HIGH (ref 70–99)
Glucose-Capillary: 157 mg/dL — ABNORMAL HIGH (ref 70–99)
Glucose-Capillary: 152 mg/dL — ABNORMAL HIGH (ref 70–99)
Glucose-Capillary: 144 mg/dL — ABNORMAL HIGH (ref 70–99)
Glucose-Capillary: 143 mg/dL — ABNORMAL HIGH (ref 70–99)
Glucose-Capillary: 164 mg/dL — ABNORMAL HIGH (ref 70–99)

## 2021-11-23 LAB — APTT
aPTT: 79 seconds — ABNORMAL HIGH (ref 24–36)
aPTT: 80 seconds — ABNORMAL HIGH (ref 24–36)

## 2021-11-23 LAB — MAGNESIUM: Magnesium: 2.4 mg/dL (ref 1.7–2.4)

## 2021-11-23 LAB — PHOSPHORUS: Phosphorus: 4 mg/dL (ref 2.5–4.6)

## 2021-11-23 MED ORDER — SODIUM CHLORIDE 0.9% FLUSH
10.0000 mL | Freq: Two times a day (BID) | INTRAVENOUS | Status: DC
  Administered 2021-11-23: 10 mL
  Administered 2021-11-23: 30 mL
  Administered 2021-11-24 – 2021-11-26 (×5): 10 mL

## 2021-11-23 MED ORDER — PANTOPRAZOLE SODIUM 40 MG IV SOLR
40.0000 mg | INTRAVENOUS | Status: DC
  Administered 2021-11-23 – 2021-11-26 (×4): 40 mg via INTRAVENOUS
  Filled 2021-11-23 (×4): qty 10

## 2021-11-23 MED ORDER — SODIUM CHLORIDE 0.9% FLUSH: 10.0000 mL | INTRAVENOUS | Status: DC | PRN

## 2021-11-23 MED ORDER — INSULIN ASPART 100 UNIT/ML IJ SOLN
0.0000 [IU] | INTRAMUSCULAR | Status: DC
  Administered 2021-11-23: 4 [IU] via SUBCUTANEOUS
  Administered 2021-11-23 – 2021-11-24 (×5): 3 [IU] via SUBCUTANEOUS
  Administered 2021-11-25: 4 [IU] via SUBCUTANEOUS
  Administered 2021-11-25 (×2): 3 [IU] via SUBCUTANEOUS
  Administered 2021-11-25 (×4): 4 [IU] via SUBCUTANEOUS
  Administered 2021-11-26 (×2): 3 [IU] via SUBCUTANEOUS

## 2021-11-23 MED ORDER — TRAVASOL 10 % IV SOLN
INTRAVENOUS | Status: AC
  Filled 2021-11-23: qty 604.8

## 2021-11-23 NOTE — IPAL (Signed)
  Interdisciplinary Goals of Care Family Meeting   Date carried out: 11/23/2021  Location of the meeting: Bedside  Member's involved: Nurse Practitioner, Bedside Registered Nurse, Respiratory Therapist, and Family Member or next of kin  Durable Power of Attorney or acting medical decision maker: Spouse     Discussion: Called to bedside by RT and general surgery PA regarding change in respiratory status. Per PA patient was seen with observed aspiration on small sip of water. This resulted in mild desaturation and increased work of breathing. Patient was placed on 12L HFNC with improvement in oxygenation but work of breathing persist.   GOC was then discussed with wife as need for advanced airway is likely to be necessary. She states that right now they would want to continue all aggressive measures including re-intubation if needed. We briefly discussed possible need of tracheostomy in the event that Stephen Whitney is re-intubated. For now wife would like more time to consider possible tracheostomy.   Code status: Full Code  Disposition: Continue current acute care   Time spent for the meeting: 25 min  Stephen Whitney 11/23/2021, 8:15 AM

## 2021-11-23 NOTE — Progress Notes (Signed)
NAME:  Stephen Whitney, MRN:  [id number], DOB:  May 13, 1951, LOS: 6 ADMISSION DATE:  11/17/2021, CONSULTATION DATE:  11/23/21 REFERRING MD: Freida Busman , CHIEF COMPLAINT:  Perforated sigmoid diverticulitis   History of Present Illness:  Stephen Whitney is a 71 y.o. M with PMH significant for Atrial fibrillation, CAD and MI, CHF, COPD, CVA who presented 6/17 with numbness, tingling and weakness in his arms and leg and pain of the back and neck.  He was diagnosed with C5-C6 compression and required emergent decompression.  On 6/19, pt developed acute respiratory distress requiring non-rebreather.  CXR was concerning for free air and CT abd/pelvis concerning for bowel perforation.  Surgery was consulted and pt taken to the OR for emergent ex-lap which revealed perforated sigmoid diverticulitis.  He was left intubated post-op and transferred to the ICU.  Pertinent  Medical History   has a past medical history of A-fib (HCC), CHF (congestive heart failure) (HCC), COPD (chronic obstructive pulmonary disease) (HCC), Dupuytren's contracture of both hands, Dysrhythmia, GAD (generalized anxiety disorder), Hepatitis C, History of atrial fibrillation (11/17/2021), History of CVA (cerebrovascular accident) (11/17/2021), History of MI (myocardial infarction) (11/17/2021), Hypertension, Osteoarthritis, and Stroke (HCC).   Significant Hospital Events: Including procedures, antibiotic start and stop dates in addition to other pertinent events   6/17 admitted with C5-C6 compression requiring emergent decompression 6/19 acute respiratory distress, free air on CT abd/pelvis, found to have perforated sigmoid diverticulitis > emergent OR 6/19 6/19 rash in the OR with Zosyn, had also received Unasyn.  Changed to Cipro/Flagyl on 6/20 Extubated 6/20.  Some subsequent tachypnea, A-fib/RVR 6/21 amiodarone 6/23 mild aspiration event, increased fio2 needs 6/23 NPO, starting TPN  Interim History / Subjective:  Still no ostomy  output, no gas, no stool.  Abdomen more distended Better rate control this morning but then had an aspiration event when swallowing water, required increased      Objective   Blood pressure 99/68, pulse 97, temperature 97.6 F (36.4 C), temperature source Oral, resp. rate (!) 26, height 5\' 6"  (1.676 m), weight 90 kg, SpO2 94 %.        Intake/Output Summary (Last 24 hours) at 11/23/2021 0839 Last data filed at 11/23/2021 0700 Gross per 24 hour  Intake 2243.33 ml  Output 2350 ml  Net -106.67 ml    Filed Weights   11/22/21 0420 11/22/21 0800 11/23/21 0500  Weight: 89.7 kg 89.7 kg 90 kg   General: Critically ill man, weak, laying in bed HEENT: Weak voice, few upper airway secretions that he has difficulty clearing Neuro: Awake and interacting.  Globally weak.  Weak cough.  Moves his upper extremities but not as lowers CV: Irregularly irregular, 105, distant, no murmur PULM: Scattered bilateral rhonchi, no wheezes GI: More distended and tympanitic.  Ostomy and wound look good.  No stool Extremities: Trace lower extremity edema Skin: some rash, faint, on skin from abd dressing tape   Chest x-ray 6/23 reviewed by me, shows small to moderate right pleural effusion, unchanged with associated right greater than left atelectasis  KUB 6/23 reviewed by me shows progressive dilation of small bowel, gas that does transit to his ostomy.  NG tube in the stomach  Resolved Hospital Problem list     Assessment & Plan:   Acute Hypoxic and Hypercarbic Respiratory Failure secondary to possible aspiration Pneumonia and underlying COPD.  Consider contribution acute diastolic CHF in the setting of A-fib.   Right pleural effusion -Swallowing precautions, now n.p.o., continue to push pulmonary hygiene.  Another aspiration event this morning. -Ensure adequate pain control to avoid abdominal noncompliance but avoid oversedation -Rate control and manage his underlying medical issues.  Diuresis as  below -Continue Xopenex, change 6/21 given his A-fib/RVR -Following chest x-ray, was not enough pleural fluid to tap on 6/22.  Continue to follow -Consider placement larger bore NG tube to decompress his abdomen since ileus is likely contributing to his respiratory compromise -At risk for reintubation due to abdominal distention, airway protection/aspiration.  Have discussed GOC with him and spouse.  He would want to be reintubated if necessary  Shock, likely septic shock + possible contribution sedating meds Pneumoperitoneum secondary to perforated Sigmoid Diverticula with presumed peritonitis  -Postop emergent ex lap and ostomy creation for sigmoid perforation with peritonitis. -Plan for 5 days total antibiotics, currently Cipro/Flagyl to end 6/24   Atrial Fibrillation with RVR postoperatively and postextubation and associated acute on chronic HFpEF -Continue amiodarone, transition to enteral when able to do so -Continue metoprolol 25 mg twice daily, may need to change to IV if unable to tolerate any p.o. meds -Continue heparin infusion, started 6/22 -Dosing diuretics daily, repeat Lasix 20 mg on 6/23  Erythematous Rash Most likely allergic, unclear etiology, on trunk, groin and face, sparing extremities -Received Benadryl 50mg , solumedrol, completed  Recent C5-C6 Compression with recent emergent decompression -Appreciate management by neurosurgery  AKI, at risk but ruled out -Intermittent Lasix dosing, dosing daily and following urine output, BMP -Renal dose medications if serum creatinine rises  Hyperglycemia, contribution steroids -Continue CBG, sliding scale insulin as per protocol  Stage I pressure injury, not POA noted due to crit illness and immobility. -Continue to follow WOC recommendations  Nutrition -Changed to n.p.o. 6/23 following aspiration event with water -Consult for initiation TPN 6/23   Best Practice (right click and "Reselect all SmartList Selections"  daily)   Diet/type: NPO w/ oral meds DVT prophylaxis: systemic heparin GI prophylaxis: PPI Lines: N/A Foley:  Yes, and it is still needed Code Status:  full code Last date of multidisciplinary goals of care discussion: 6/23, see IPAL note  Labs   CBC: Recent Labs  Lab 11/17/21 0756 11/18/21 0315 Dec 08, 2021 0337 Dec 08, 2021 1904 11/20/21 0514 11/20/21 1839 11/21/21 0459 11/22/21 0122 11/23/21 0441  WBC 10.8*   < > 14.4*  --  8.4  --  9.3 10.5 12.7*  NEUTROABS 8.1*  --   --   --   --   --   --   --   --   HGB 11.7*   < > 10.8*   < > 9.1* 8.5* 9.2* 9.7* 9.8*  HCT 33.1*   < > 31.2*   < > 26.3* 25.0* 26.7* 28.8* 29.3*  MCV 91.4   < > 93.4  --  92.3  --  92.7 94.1 93.6  PLT 144*   < > 116*  --  99*  --  103* 113* 116*   < > = values in this interval not displayed.     Basic Metabolic Panel: Recent Labs  Lab 2021-12-08 0337 2021/12/08 1904 11/20/21 0514 11/20/21 1839 11/21/21 0459 11/22/21 0122 11/23/21 0441  NA 133*   < > 136 138 138 141 139  K 4.4   < > 4.3 4.2 4.2 4.0 4.1  CL 104  --  107  --  105 103 101  CO2 21*  --  24  --  24 27 26   GLUCOSE 130*  --  197*  --  122* 137* 159*  BUN 33*  --  23  --  29* 36* 29*  CREATININE 1.29*  --  1.11  --  1.18 1.27* 1.08  CALCIUM 8.5*  --  8.1*  --  8.5* 8.5* 8.4*  MG  --   --   --   --  2.1 1.9 2.4  PHOS  --   --   --   --   --  4.5  --    < > = values in this interval not displayed.    GFR: Estimated Creatinine Clearance: 67.8 mL/min (by C-G formula based on SCr of 1.08 mg/dL). Recent Labs  Lab 11/19/21 1649 11/20/21 0514 11/21/21 0459 11/22/21 0122 11/23/21 0441  WBC  --  8.4 9.3 10.5 12.7*  LATICACIDVEN 2.3*  --   --   --   --      Liver Function Tests: Recent Labs  Lab Dec 04, 2021 0756 11/18/21 0315 11/19/21 0337 11/20/21 0514 11/21/21 0459  AST 14* 19 25 38 38  ALT 15 19 15 17 23   ALKPHOS 30* 33* 26* 18* 23*  BILITOT 0.5 1.1 1.1 0.9 0.9  PROT 4.4* 5.3* 4.9* 4.5* 4.6*  ALBUMIN 2.8* 3.1* 2.8* 2.5* 2.4*     No results for input(s): "LIPASE", "AMYLASE" in the last 168 hours. No results for input(s): "AMMONIA" in the last 168 hours.  ABG    Component Value Date/Time   PHART 7.459 (H) 11/20/2021 1839   PCO2ART 34.7 11/20/2021 1839   PO2ART 51 (L) 11/20/2021 1839   HCO3 24.7 11/20/2021 1839   TCO2 26 11/20/2021 1839   ACIDBASEDEF 1.0 11/19/2021 2142   O2SAT 89 11/20/2021 1839     Coagulation Profile: Recent Labs  Lab 11/19/21 1649  INR 1.2     Cardiac Enzymes: No results for input(s): "CKTOTAL", "CKMB", "CKMBINDEX", "TROPONINI" in the last 168 hours.  HbA1C: Hgb A1c MFr Bld  Date/Time Value Ref Range Status  11/20/2021 05:14 AM 5.4 4.8 - 5.6 % Final    Comment:    (NOTE) Pre diabetes:          5.7%-6.4%  Diabetes:              >6.4%  Glycemic control for   <7.0% adults with diabetes     CBG: Recent Labs  Lab 11/22/21 1521 11/22/21 1939 11/22/21 2324 11/23/21 0345 11/23/21 0737  GLUCAP 178* 156* 188* 155* 157*      Critical care time: 34 minutes    CRITICAL CARE Performed by: Delton Coombes   Total critical care time: 34 minutes  Critical care time was exclusive of separately billable procedures and treating other patients.  Critical care was necessary to treat or prevent imminent or life-threatening deterioration.  Critical care was time spent personally by me on the following activities: development of treatment plan with patient and/or surrogate as well as nursing, discussions with consultants, evaluation of patient's response to treatment, examination of patient, obtaining history from patient or surrogate, ordering and performing treatments and interventions, ordering and review of laboratory studies, ordering and review of radiographic studies, pulse oximetry and re-evaluation of patient's condition.   Levy Pupa, MD, PhD 11/23/2021, 8:39 AM [facility] [phone] or if no answer before 7:00PM call [phone] For any  issues after 7:00PM please call eLink [phone]

## 2021-11-23 NOTE — Progress Notes (Signed)
ANTICOAGULATION CONSULT NOTE - Follow Up Consult  Pharmacy Consult for Heparin Indication: atrial fibrillation  Allergies  Allergen Reactions   Unasyn [Ampicillin-Sulbactam Sodium] Rash   Zosyn [Piperacillin Sod-Tazobactam So] Rash    Patient Measurements: Height: 5\' 6"  (167.6 cm) Weight: 90 kg (198 lb 6.6 oz) IBW/kg (Calculated) : 63.8 Heparin Dosing Weight: 82.7 kg   Vital Signs: Temp: 98.1 F (36.7 C) (06/23 1132) Temp Source: Oral (06/23 1132) BP: 108/70 (06/23 1200) Pulse Rate: 109 (06/23 1200)  Labs: Recent Labs    11/21/21 0459 11/22/21 0122 11/22/21 1537 11/22/21 2250 11/23/21 0441 11/23/21 0758  HGB 9.2* 9.7*  --   --  9.8*  --   HCT 26.7* 28.8*  --   --  29.3*  --   PLT 103* 113*  --   --  116*  --   APTT  --   --  42* 48*  --  79*  HEPARINUNFRC  --   --  0.38  --   --  0.68  CREATININE 1.18 1.27*  --   --  1.08  --      Estimated Creatinine Clearance: 67.8 mL/min (by C-G formula based on SCr of 1.08 mg/dL).   Medical History: Past Medical History:  Diagnosis Date   A-fib [facility])    CHF (congestive heart failure) (HCC)    COPD (chronic obstructive pulmonary disease) (HCC)    Dupuytren's contracture of both hands    Dysrhythmia    GAD (generalized anxiety disorder)    Hepatitis C    History of atrial fibrillation 11/17/2021   History of CVA (cerebrovascular accident) 11/17/2021   History of MI (myocardial infarction) 11/17/2021   Hypertension    Osteoarthritis    Stroke [facility])      Assessment: 71 yo male presented on 11/17/2021 with C5-6 compression requiring emergent decompression then on 2021/12/18 found to have perforated sigmoid diverticulitis s/p exlap. Patient is on apixaban prior to admission for Afib and received Kcentra x2 for surgeries (6/17, 6/19).  Last dose apixaban 6/19 AM. Pharmacy consulted to start heparin infusion for afib with no bolus. Hgb 9.7. Plt 113. Slight oozing noted on dressing change this AM.   Aptt  80 (on heparin  1500 units/hr)  Goal of Therapy:  Heparin level 0.3-0.5 units/ml aPTT 66-85 seconds  Monitor platelets by anticoagulation protocol: Yes   Plan:  Continue heparin 1500 units/hr  Transition to monitoring via heparin level when aPTT and heparin level correlating due to previous apixaban falsely elevating heparin level  Monitor heparin level, aPTT, CBC and s/s of bleeding daily  Follow up transition to oral anticoagulant    Thank you for allowing pharmacy to be a part of this patient's care.  Tomie China, PharmD Clinical Pharmacist  Please check AMION for all [facility] Pharmacy numbers After 10:00 PM, call Main Pharmacy [phone]

## 2021-11-24 ENCOUNTER — Inpatient Hospital Stay (HOSPITAL_COMMUNITY): Payer: Medicare HMO

## 2021-11-24 DIAGNOSIS — M4802 Spinal stenosis, cervical region: Secondary | ICD-10-CM

## 2021-11-24 DIAGNOSIS — E440 Moderate protein-calorie malnutrition: Secondary | ICD-10-CM

## 2021-11-24 LAB — COMPREHENSIVE METABOLIC PANEL
ALT: 21 U/L (ref 0–44)
ALT: 20 U/L (ref 0–44)
AST: 18 U/L (ref 15–41)
AST: 18 U/L (ref 15–41)
Albumin: 2.3 g/dL — ABNORMAL LOW (ref 3.5–5.0)
Albumin: 2.3 g/dL — ABNORMAL LOW (ref 3.5–5.0)
Alkaline Phosphatase: 22 U/L — ABNORMAL LOW (ref 38–126)
Alkaline Phosphatase: 23 U/L — ABNORMAL LOW (ref 38–126)
Anion gap: 7 (ref 5–15)
Anion gap: 8 (ref 5–15)
BUN: 34 mg/dL — ABNORMAL HIGH (ref 8–23)
BUN: 33 mg/dL — ABNORMAL HIGH (ref 8–23)
CO2: 29 mmol/L (ref 22–32)
CO2: 29 mmol/L (ref 22–32)
Calcium: 8.3 mg/dL — ABNORMAL LOW (ref 8.9–10.3)
Calcium: 8.4 mg/dL — ABNORMAL LOW (ref 8.9–10.3)
Chloride: 102 mmol/L (ref 98–111)
Chloride: 103 mmol/L (ref 98–111)
Creatinine, Ser: 1.13 mg/dL (ref 0.61–1.24)
Creatinine, Ser: 1.06 mg/dL (ref 0.61–1.24)
GFR, Estimated: >60 mL/min (ref >60)
GFR, Estimated: >60 mL/min (ref >60)
Glucose, Bld: 143 mg/dL — ABNORMAL HIGH (ref 70–99)
Glucose, Bld: 138 mg/dL — ABNORMAL HIGH (ref 70–99)
Potassium: 3.8 mmol/L (ref 3.5–5.1)
Potassium: 3.9 mmol/L (ref 3.5–5.1)
Sodium: 138 mmol/L (ref 135–145)
Sodium: 140 mmol/L (ref 135–145)
Total Bilirubin: 0.6 mg/dL (ref 0.3–1.2)
Total Bilirubin: 0.8 mg/dL (ref 0.3–1.2)
Total Protein: 4.6 g/dL — ABNORMAL LOW (ref 6.5–8.1)
Total Protein: 4.6 g/dL — ABNORMAL LOW (ref 6.5–8.1)

## 2021-11-24 LAB — PHOSPHORUS: Phosphorus: 3.4 mg/dL (ref 2.5–4.6)

## 2021-11-24 LAB — CULTURE, BLOOD (ROUTINE X 2)
Culture: NO GROWTH
Culture: NO GROWTH
Special Requests: ADEQUATE
Special Requests: ADEQUATE

## 2021-11-24 LAB — GLUCOSE, CAPILLARY
Glucose-Capillary: 115 mg/dL — ABNORMAL HIGH (ref 70–99)
Glucose-Capillary: 135 mg/dL — ABNORMAL HIGH (ref 70–99)
Glucose-Capillary: 148 mg/dL — ABNORMAL HIGH (ref 70–99)
Glucose-Capillary: 123 mg/dL — ABNORMAL HIGH (ref 70–99)
Glucose-Capillary: 144 mg/dL — ABNORMAL HIGH (ref 70–99)
Glucose-Capillary: 153 mg/dL — ABNORMAL HIGH (ref 70–99)

## 2021-11-24 LAB — CBC
HCT: 26.9 % — ABNORMAL LOW (ref 39.0–52.0)
Hemoglobin: 9.1 g/dL — ABNORMAL LOW (ref 13.0–17.0)
MCH: 31.7 pg (ref 26.0–34.0)
MCHC: 33.8 g/dL (ref 30.0–36.0)
MCV: 93.7 fL (ref 80.0–100.0)
Platelets: 127 10*3/uL — ABNORMAL LOW (ref 150–400)
RBC: 2.87 MIL/uL — ABNORMAL LOW (ref 4.22–5.81)
RDW: 12 % (ref 11.5–15.5)
WBC: 12.6 10*3/uL — ABNORMAL HIGH (ref 4.0–10.5)
nRBC: 0 % (ref 0.0–0.2)

## 2021-11-24 LAB — TRIGLYCERIDES: Triglycerides: 41 mg/dL (ref <150)

## 2021-11-24 LAB — HEPARIN LEVEL (UNFRACTIONATED): Heparin Unfractionated: 0.49 IU/mL (ref 0.30–0.70)

## 2021-11-24 LAB — APTT: aPTT: 63 seconds — ABNORMAL HIGH (ref 24–36)

## 2021-11-24 LAB — MAGNESIUM: Magnesium: 2.3 mg/dL (ref 1.7–2.4)

## 2021-11-24 MED ORDER — TRAVASOL 10 % IV SOLN
INTRAVENOUS | Status: AC
  Filled 2021-11-24: qty 1209.6

## 2021-11-24 NOTE — Progress Notes (Signed)
Spoke with RN regarding lab draws via PICC. TPN infusing and heparin infusing via R hand. Educated on proper technique for lab collection. Advised to remove PIV from R extremity, and place heparin drip to left extremity.

## 2021-11-24 NOTE — Progress Notes (Signed)
NAME:  Stephen Whitney, MRN:  [id number], DOB:  July 22, 1950, LOS: 7 ADMISSION DATE:  11/17/2021, CONSULTATION DATE:  11/24/21 REFERRING MD: Freida Busman , CHIEF COMPLAINT:  Perforated sigmoid diverticulitis   History of Present Illness:  Stephen Whitney is a 71 y.o. M with PMH significant for Atrial fibrillation, CAD and MI, CHF, COPD, CVA who presented 6/17 with numbness, tingling and weakness in his arms and leg and pain of the back and neck.  He was diagnosed with C5-C6 compression and required emergent decompression.  On 6/19, pt developed acute respiratory distress requiring non-rebreather.  CXR was concerning for free air and CT abd/pelvis concerning for bowel perforation.  Surgery was consulted and pt taken to the OR for emergent ex-lap which revealed perforated sigmoid diverticulitis.  He was left intubated post-op and transferred to the ICU.  Pertinent  Medical History   has a past medical history of A-fib (HCC), CHF (congestive heart failure) (HCC), COPD (chronic obstructive pulmonary disease) (HCC), Dupuytren's contracture of both hands, Dysrhythmia, GAD (generalized anxiety disorder), Hepatitis C, History of atrial fibrillation (11/17/2021), History of CVA (cerebrovascular accident) (11/17/2021), History of MI (myocardial infarction) (11/17/2021), Hypertension, Osteoarthritis, and Stroke (HCC).   Significant Hospital Events: Including procedures, antibiotic start and stop dates in addition to other pertinent events   6/17 admitted with C5-C6 compression requiring emergent decompression 6/19 acute respiratory distress, free air on CT abd/pelvis, found to have perforated sigmoid diverticulitis > emergent OR 6/19 6/19 rash in the OR with Zosyn, had also received Unasyn.  Changed to Cipro/Flagyl on 6/20 Extubated 6/20.  Some subsequent tachypnea, A-fib/RVR 6/21 amiodarone 6/23 mild aspiration event, increased fio2 needs 6/23 increased abdominal distention, worsening respiratory failure.  NG placed  with 1200 cc out.  Respiratory status improved.  Started on TPN  Interim History / Subjective:  No significant overnight events.  Decreased oxygen requirement since yesterday. No significant NG output overnight.  Abdominal pain is improved.  Urine output remains stable.  No stool output from ostomy Labs remained stable.  Objective   Blood pressure 107/87, pulse (!) 103, temperature 97.9 F (36.6 C), temperature source Oral, resp. rate (!) 28, height 5\' 6"  (1.676 m), weight 87.1 kg, SpO2 100 %.        Intake/Output Summary (Last 24 hours) at 11/24/2021 0849 Last data filed at 11/24/2021 0600 Gross per 24 hour  Intake 1886.96 ml  Output 2625 ml  Net -738.04 ml    Filed Weights   11/22/21 0800 11/23/21 0500 11/24/21 0500  Weight: 89.7 kg 90 kg 87.1 kg   General: Critically ill, frail appearing elderly gentleman lying in bed in no distress Cardiac: Heart regular rate, irregular rhythm, no lower extremity edema Pulm: Breathing fairly comfortably on 6 L nasal cannula.  Diffuse rhonchi bilaterally GI: Still mildly distended but improved from yesterday.  Clean and intact dressing to the midline surgical incision.  No gas/stool in the ostomy bag.  Ostomy appears okay. Skin: Warm and dry Neuro: Alert and oriented   Resolved Hospital Problem list   AKI Septic shock Assessment & Plan:   Acute Hypoxic and Hypercarbic Respiratory Failure  Likely multifactorial including deconditioning, atelectasis, A-fib with underlying HFpEF, COPD Right pleural effusion -Respiratory status improved from yesterday after the placement of NG tube with 1200 cc out.  Wean oxygen as able - Pain continues to be the limiting factor to airway clearance.  Continue to encourage deep coughs, deep breaths -Continue Xopenex -Following chest x-ray, was not enough pleural fluid to tap on 6/22.  Continue to follow  Perforated Sigmoid diverticulitis status post ex lap with ostomy creation (6/19).  Complicated by  postoperative ileus -Plan for 5 days total antibiotics, currently Cipro/Flagyl to end 6/24 - NG placed 6/23. -Management per general surgery  Atrial Fibrillation with RVR postoperatively and postextubation and associated acute on chronic HFpEF -Continue amiodarone, transition to enteral when able to do so -Continue metoprolol 25 mg twice daily, may need to change to IV if unable to tolerate any p.o. meds -Continue heparin infusion, started 6/22 -Dosing diuretics daily, repeat Lasix 20 mg on 6/23  Recent C5-C6 Compression with recent emergent decompression -Appreciate management by neurosurgery  Hyperglycemia, contribution steroids -Continue CBG, sliding scale insulin as per protocol  Stage I pressure injury, not POA noted due to crit illness and immobility. -Continue to follow WOC recommendations  Nutrition -Continue TPN (started 6/23)   Best Practice (right click and "Reselect all SmartList Selections" daily)   Diet/type: TPN DVT prophylaxis: systemic heparin GI prophylaxis: PPI Lines: N/A Foley:  Yes, and it is still needed Code Status:  full code Last date of multidisciplinary goals of care discussion: 6/23, see IPAL note  Elige Radon, MD Internal Medicine Resident PGY-3 [facility] 11/24/2021 9:15 AM

## 2021-11-25 ENCOUNTER — Inpatient Hospital Stay (HOSPITAL_COMMUNITY): Payer: Medicare HMO

## 2021-11-25 LAB — BASIC METABOLIC PANEL
Anion gap: 7 (ref 5–15)
Anion gap: 7 (ref 5–15)
BUN: 31 mg/dL — ABNORMAL HIGH (ref 8–23)
BUN: 31 mg/dL — ABNORMAL HIGH (ref 8–23)
CO2: 25 mmol/L (ref 22–32)
CO2: 29 mmol/L (ref 22–32)
Calcium: 8.4 mg/dL — ABNORMAL LOW (ref 8.9–10.3)
Calcium: 8.4 mg/dL — ABNORMAL LOW (ref 8.9–10.3)
Chloride: 108 mmol/L (ref 98–111)
Chloride: 105 mmol/L (ref 98–111)
Creatinine, Ser: 1 mg/dL (ref 0.61–1.24)
Creatinine, Ser: 1.01 mg/dL (ref 0.61–1.24)
GFR, Estimated: >60 mL/min (ref >60)
GFR, Estimated: >60 mL/min (ref >60)
Glucose, Bld: 142 mg/dL — ABNORMAL HIGH (ref 70–99)
Glucose, Bld: 164 mg/dL — ABNORMAL HIGH (ref 70–99)
Potassium: 3.9 mmol/L (ref 3.5–5.1)
Potassium: 3.6 mmol/L (ref 3.5–5.1)
Sodium: 140 mmol/L (ref 135–145)
Sodium: 141 mmol/L (ref 135–145)

## 2021-11-25 LAB — POCT I-STAT 7, (LYTES, BLD GAS, ICA,H+H)
Acid-Base Excess: 3 mmol/L — ABNORMAL HIGH (ref 0.0–2.0)
Bicarbonate: 27.6 mmol/L (ref 20.0–28.0)
Calcium, Ion: 1.23 mmol/L (ref 1.15–1.40)
HCT: 28 % — ABNORMAL LOW (ref 39.0–52.0)
Hemoglobin: 9.5 g/dL — ABNORMAL LOW (ref 13.0–17.0)
O2 Saturation: 86 %
Patient temperature: 98.3
Potassium: 3.9 mmol/L (ref 3.5–5.1)
Sodium: 139 mmol/L (ref 135–145)
TCO2: 29 mmol/L (ref 22–32)
pCO2 arterial: 41.2 mmHg (ref 32–48)
pH, Arterial: 7.434 (ref 7.35–7.45)
pO2, Arterial: 49 mmHg — ABNORMAL LOW (ref 83–108)

## 2021-11-25 LAB — GLUCOSE, CAPILLARY
Glucose-Capillary: 128 mg/dL — ABNORMAL HIGH (ref 70–99)
Glucose-Capillary: 146 mg/dL — ABNORMAL HIGH (ref 70–99)
Glucose-Capillary: 153 mg/dL — ABNORMAL HIGH (ref 70–99)
Glucose-Capillary: 163 mg/dL — ABNORMAL HIGH (ref 70–99)
Glucose-Capillary: 168 mg/dL — ABNORMAL HIGH (ref 70–99)
Glucose-Capillary: 169 mg/dL — ABNORMAL HIGH (ref 70–99)

## 2021-11-25 LAB — CBC
HCT: 30 % — ABNORMAL LOW (ref 39.0–52.0)
Hemoglobin: 10 g/dL — ABNORMAL LOW (ref 13.0–17.0)
MCH: 31.8 pg (ref 26.0–34.0)
MCHC: 33.3 g/dL (ref 30.0–36.0)
MCV: 95.5 fL (ref 80.0–100.0)
Platelets: 135 10*3/uL — ABNORMAL LOW (ref 150–400)
RBC: 3.14 MIL/uL — ABNORMAL LOW (ref 4.22–5.81)
RDW: 12.2 % (ref 11.5–15.5)
WBC: 12 10*3/uL — ABNORMAL HIGH (ref 4.0–10.5)
nRBC: 0 % (ref 0.0–0.2)

## 2021-11-25 LAB — HEPARIN LEVEL (UNFRACTIONATED): Heparin Unfractionated: 0.42 IU/mL (ref 0.30–0.70)

## 2021-11-25 LAB — MAGNESIUM: Magnesium: 2.2 mg/dL (ref 1.7–2.4)

## 2021-11-25 LAB — PHOSPHORUS: Phosphorus: 3.6 mg/dL (ref 2.5–4.6)

## 2021-11-25 MED ORDER — POTASSIUM CHLORIDE 20 MEQ PO PACK: 20.0000 meq | PACK | Freq: Once | ORAL | Status: DC

## 2021-11-25 MED ORDER — FUROSEMIDE 10 MG/ML IJ SOLN
40.0000 mg | Freq: Once | INTRAMUSCULAR | Status: AC
  Administered 2021-11-25: 40 mg via INTRAVENOUS

## 2021-11-25 MED ORDER — FUROSEMIDE 10 MG/ML IJ SOLN
40.0000 mg | Freq: Two times a day (BID) | INTRAMUSCULAR | Status: AC
  Administered 2021-11-25 (×2): 40 mg via INTRAVENOUS
  Filled 2021-11-25 (×2): qty 4

## 2021-11-25 MED ORDER — TRAVASOL 10 % IV SOLN
INTRAVENOUS | Status: DC
  Filled 2021-11-25: qty 1209.6

## 2021-11-25 MED ORDER — POTASSIUM CHLORIDE 10 MEQ/50ML IV SOLN
10.0000 meq | INTRAVENOUS | Status: AC
  Administered 2021-11-25 (×4): 10 meq via INTRAVENOUS
  Filled 2021-11-25 (×4): qty 50

## 2021-11-25 MED ORDER — FUROSEMIDE 10 MG/ML IJ SOLN
INTRAMUSCULAR | Status: AC
  Filled 2021-11-25: qty 4

## 2021-11-25 NOTE — Progress Notes (Addendum)
eLink Physician-Brief Progress Note Patient Name: Stephen Whitney DOB: 26-Sep-1950 MRN: [id number]   Date of Service  11/25/2021  HPI/Events of Note  Notified as pt desaturated to the 70s as he was being turned for nursing care.  He was placed back on his back, HOB elevated.  O2 was increased to NRB, and chest physiotherapy initiated.  SpO2 improved to 95%, but now hovering the low 90s. PT awake, alert, but has a weak cough.   ABG done showed pO2 of 49 on current support.  CXR shows persistent bilateral pleural effusions.   eICU Interventions  Suspect pt continuing to have episodes of mucus plugging, aspiration.  PE possible, but unlikelye as pt has been maintianed on anticoagulation for underlying Afib.   Alerted gound team for possible intubation.  Will continue O2 support.         Nikai Quest M DELA CRUZ 11/25/2021, 1:12 AM

## 2021-11-25 NOTE — Progress Notes (Signed)
[facility]  I was called to the bedside to evaluate Stephen Whitney because of increased work of breathing.  On my exam he is tachypneic (high 20's to 30's) and has a very weak cough.  He is requiring a non-rebreather and salter.  He has rhonchi bilaterally and significant edema in his arms and legs.  He has a cor-track and NG tube in place.  I discussed the situation with the patient.  If we end up having to intubate him then he will need a tracheostomy given this scenario (paraplegia, major abdominal surgery).  The patient doesn't want a tracheostomy and he tells me that he doesn't want to go on life support.  When I explained that if he doesn't want to be on life support then we need to write an order for DNR.  He says that he understands this but he needs to have the conversation with his wife before he makes that decision.  I updated his wife about the situation and she agreed that he doesn't want to go on life support.  She is coming in to discuss this further with him.  In the meantime we'll give lasix and BIPAP to try to temporize his respiratory status.  Cc time 35 minutes  Heber Amboy, MD [facility] Pager: [phone] Cell: [phone] After 7:00 pm call Elink  [phone]

## 2021-11-25 NOTE — Progress Notes (Signed)
PHARMACY - TOTAL PARENTERAL NUTRITION CONSULT NOTE   Indication: Prolonged ileus  Patient Measurements: Height: 5\' 6"  (167.6 cm) Weight: 93.4 kg (205 lb 14.6 oz) IBW/kg (Calculated) : 63.8 TPN AdjBW (KG): 70.3 Body mass index is 33.23 kg/m. Usual Weight: 90kg   Assessment: 70 yo male presented on 11/17/2021 with C5-6 compression requiring emergent decompression then on 11/19/2021 found to have perforated sigmoid diverticulitis s/p exlap with sigmoid resection, end sigmoid colostomy, and repair of small bowl serosal injury. Pharmacy consulted to start TPN for prolonged ileus.    Glucose / Insulin: CBGs <150s. Utilized 12 units insulin / 24 hrs of sSSI.  - a1c 5.4.  Electrolytes: Na 141, K 3.6 (need to replace), Cl 105, CoCa 9.6, Mg 2.2, phos 3.6. Other electrolytes wnl.  Renal: Scr at baseline. BUN 31.  Hepatic: LFTs wnl. Albumin 2.4. T bili wnl. TG 69.  Intake / Output; MIVF: 1.8 ml/kg/hr. Emesis 300 mls. No MIVF. Neg 3.5L.  GI Imaging: - 6/19 CT abdomen: small volue pneumoperitoneum, suggestive of bowel perforation  - 6/23 abdomen xray: ileus vs. SBO GI Surgeries / Procedures:  - 6/17 C5-6 emergent decompression - 6.19 ex lap for perforated sigmoid diverticulitis   Central access: 11/23/2021 TPN start date: 11/23/2021  Nutritional Goals: Goal TPN rate is 90 mL/hr (provides 121 g of protein and 2065 kcals per day)  RD Assessment: Estimated Needs Total Energy Estimated Needs: 2000-2200 Total Protein Estimated Needs: 110-130 grams Total Fluid Estimated Needs: >2.0 L  Current Nutrition:  NPO and TPN  Plan:  Continue TPN at 61mL/hr at 1800 to provide 121g AA and 2065 kcal to meet ~100% of patient's needs  Electrolytes in TPN: Na 22mEq/L, Incr K 9mEq/L, Ca 52mEq/L, Mg 57mEq/L, and decr Phos 42mmol/L. Cl:Ac 1:1 Add standard MVI and trace elements to TPN Continue sSSI q4h until new TPN then start rSSI q4h with TPN start. Adjust as needed  Replete with KCL 10 meq x 4 Monitor TPN  labs on Mon/Thurs, check TPN labs tomorrow  Jeanella Cara, PharmD, [facility] Clinical Pharmacist Please see AMION for all Pharmacists' Contact Phone Numbers 11/25/2021, 10:36 AM

## 2021-11-26 DIAGNOSIS — Z66 Do not resuscitate: Secondary | ICD-10-CM

## 2021-11-26 LAB — COMPREHENSIVE METABOLIC PANEL
ALT: 17 U/L (ref 0–44)
AST: 18 U/L (ref 15–41)
Albumin: 2.2 g/dL — ABNORMAL LOW (ref 3.5–5.0)
Alkaline Phosphatase: 24 U/L — ABNORMAL LOW (ref 38–126)
Anion gap: 10 (ref 5–15)
BUN: 40 mg/dL — ABNORMAL HIGH (ref 8–23)
CO2: 29 mmol/L (ref 22–32)
Calcium: 8.6 mg/dL — ABNORMAL LOW (ref 8.9–10.3)
Chloride: 105 mmol/L (ref 98–111)
Creatinine, Ser: 1.19 mg/dL (ref 0.61–1.24)
GFR, Estimated: >60 mL/min (ref >60)
Glucose, Bld: 194 mg/dL — ABNORMAL HIGH (ref 70–99)
Potassium: 4.5 mmol/L (ref 3.5–5.1)
Sodium: 144 mmol/L (ref 135–145)
Total Bilirubin: 0.5 mg/dL (ref 0.3–1.2)
Total Protein: 4.9 g/dL — ABNORMAL LOW (ref 6.5–8.1)

## 2021-11-26 LAB — MAGNESIUM: Magnesium: 2.2 mg/dL (ref 1.7–2.4)

## 2021-11-26 LAB — GLUCOSE, CAPILLARY
Glucose-Capillary: 150 mg/dL — ABNORMAL HIGH (ref 70–99)
Glucose-Capillary: 146 mg/dL — ABNORMAL HIGH (ref 70–99)
Glucose-Capillary: 143 mg/dL — ABNORMAL HIGH (ref 70–99)

## 2021-11-26 LAB — CBC
HCT: 29.3 % — ABNORMAL LOW (ref 39.0–52.0)
Hemoglobin: 9.9 g/dL — ABNORMAL LOW (ref 13.0–17.0)
MCH: 32.2 pg (ref 26.0–34.0)
MCHC: 33.8 g/dL (ref 30.0–36.0)
MCV: 95.4 fL (ref 80.0–100.0)
Platelets: 155 10*3/uL (ref 150–400)
RBC: 3.07 MIL/uL — ABNORMAL LOW (ref 4.22–5.81)
RDW: 12.4 % (ref 11.5–15.5)
WBC: 11.9 10*3/uL — ABNORMAL HIGH (ref 4.0–10.5)
nRBC: 0.3 % — ABNORMAL HIGH (ref 0.0–0.2)

## 2021-11-26 LAB — PHOSPHORUS: Phosphorus: 3.9 mg/dL (ref 2.5–4.6)

## 2021-11-26 LAB — HEPARIN LEVEL (UNFRACTIONATED): Heparin Unfractionated: 0.28 IU/mL — ABNORMAL LOW (ref 0.30–0.70)

## 2021-11-26 LAB — TRIGLYCERIDES: Triglycerides: 66 mg/dL (ref <150)

## 2021-11-26 MED ORDER — GLYCOPYRROLATE 0.2 MG/ML IJ SOLN: 0.2000 mg | INTRAMUSCULAR | Status: DC | PRN

## 2021-11-26 MED ORDER — DIPHENHYDRAMINE HCL 50 MG/ML IJ SOLN: 25.0000 mg | INTRAMUSCULAR | Status: DC | PRN

## 2021-11-26 MED ORDER — MORPHINE BOLUS VIA INFUSION: 5.0000 mg | INTRAVENOUS | Status: DC | PRN

## 2021-11-26 MED ORDER — MORPHINE BOLUS VIA INFUSION: 2.0000 mg | INTRAVENOUS | Status: DC | PRN

## 2021-11-26 MED ORDER — GLYCOPYRROLATE 1 MG PO TABS: 1.0000 mg | ORAL_TABLET | ORAL | Status: DC | PRN

## 2021-11-26 MED ORDER — MORPHINE 100MG IN NS 100ML (1MG/ML) PREMIX INFUSION
0.0000 mg/h | INTRAVENOUS | Status: DC
  Administered 2021-11-26: 2 mg/h via INTRAVENOUS
  Filled 2021-11-26: qty 100

## 2021-11-26 MED ORDER — GLYCOPYRROLATE 0.2 MG/ML IJ SOLN
0.2000 mg | INTRAMUSCULAR | Status: DC | PRN
  Administered 2021-11-26: 0.2 mg via INTRAVENOUS
  Filled 2021-11-26: qty 1

## 2021-11-26 MED ORDER — DEXTROSE 5 % IV SOLN: INTRAVENOUS | Status: DC

## 2021-11-26 MED ORDER — MORPHINE SULFATE (PF) 2 MG/ML IV SOLN
2.0000 mg | INTRAVENOUS | Status: DC | PRN
  Administered 2021-11-26: 2 mg via INTRAVENOUS
  Filled 2021-11-26: qty 1

## 2021-11-26 MED ORDER — GLYCOPYRROLATE 0.2 MG/ML IJ SOLN
0.2000 mg | INTRAMUSCULAR | Status: DC | PRN
Start: 2021-11-26 — End: 2021-11-26

## 2021-12-01 DEATH — deceased

## 2023-05-14 IMAGING — DX DG CHEST 1V PORT
1 series · 1 of 1 positions shown · non-contrast
Comparison: Chest x-ray from yesterday

CLINICAL DATA: [id number] right pleural effusion

EXAM:
PORTABLE CHEST 1 VIEW

[chest ap]
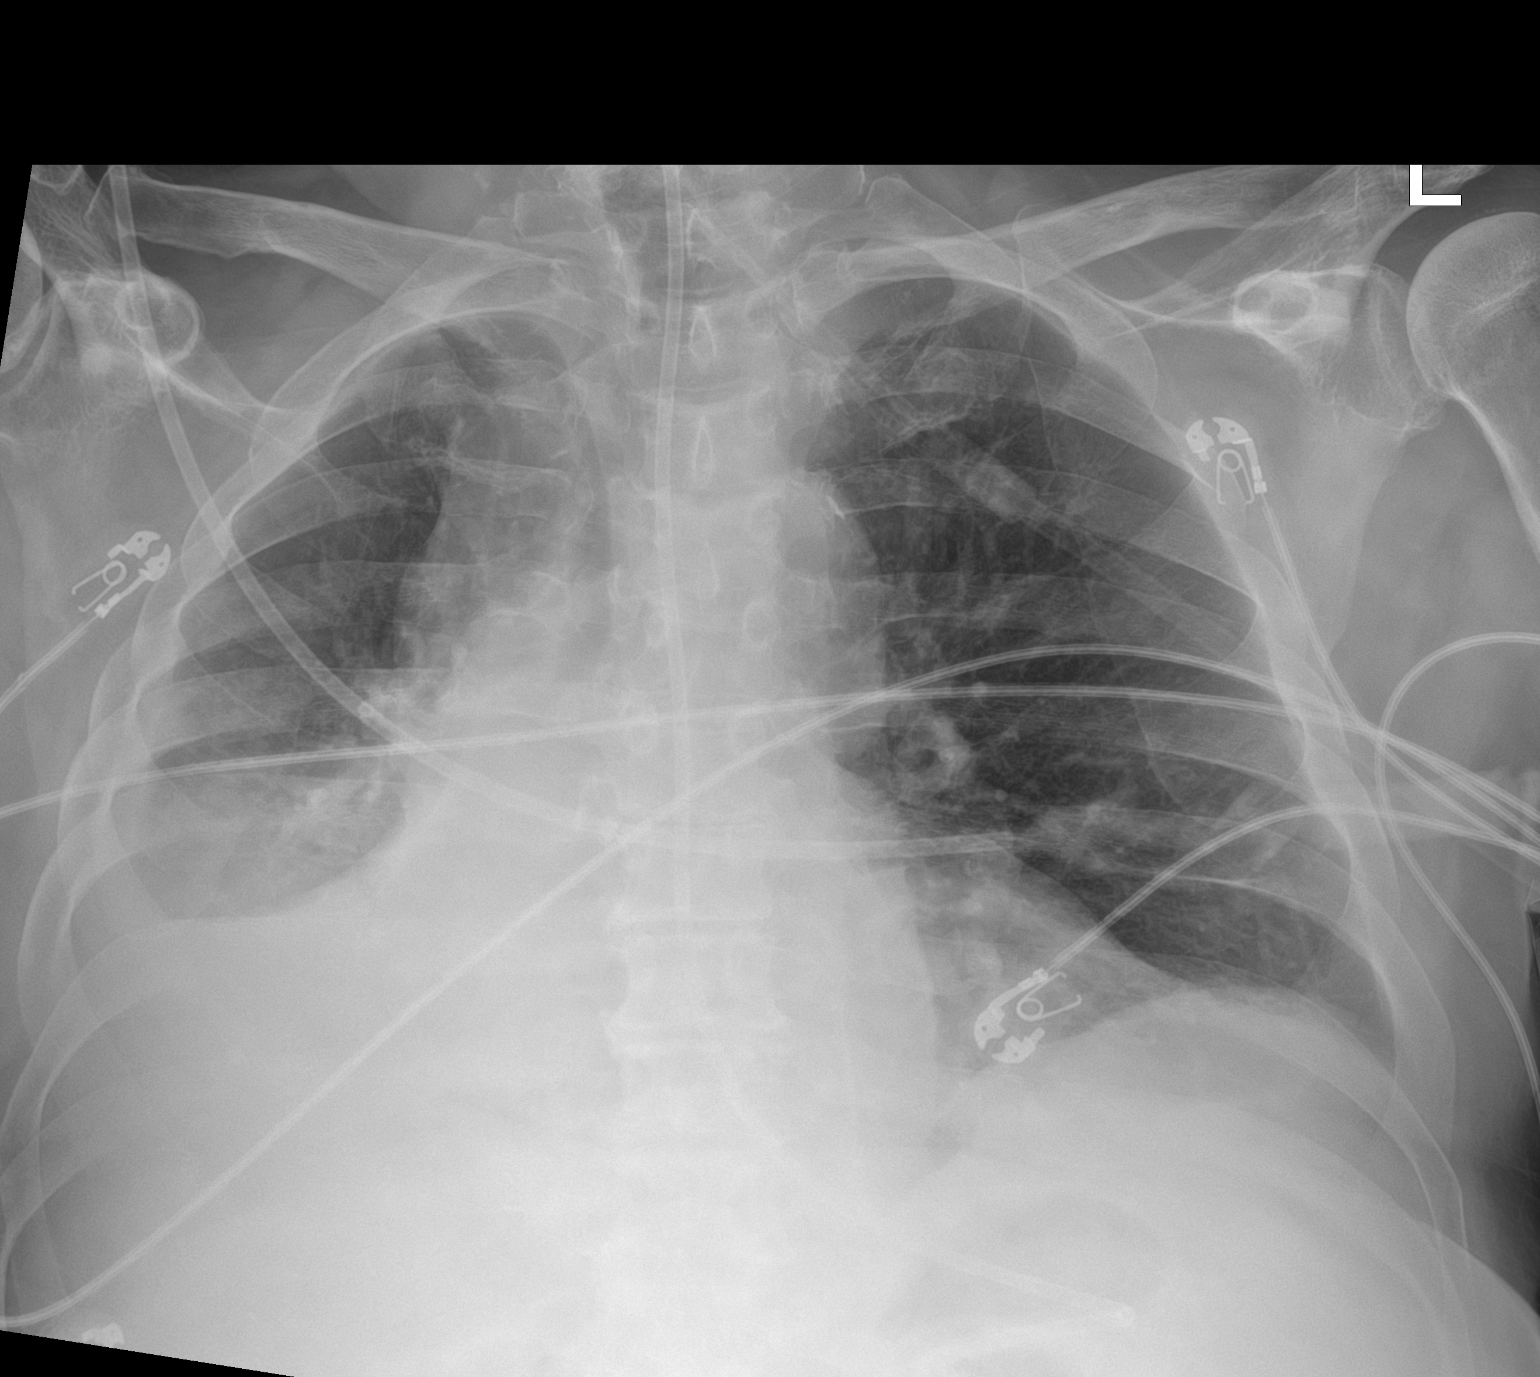

[1 of 1 positions shown; findings below may reference images not displayed]

FINDINGS: There is a moderate right pleural effusion and has mildly increased
in the interim. There has been interval insertion of a feeding tube
with its tip below the GE junction. Cardiomegaly. Minor atelectasis
at the left lung base. Likely superimposed atelectasis at the right
lung base.
IMPRESSION: There has been interval insertion of a feeding tube with its tip
likely in the body of the stomach. Moderate right pleural effusion
and has mildly increased in the interim. Minor left atelectasis.

## 2023-05-15 IMAGING — DX DG CHEST 1V PORT
1 series · 1 of 1 positions shown · non-contrast
Comparison: Portable exam 2023 hours compared to earlier study of
11/23/2021

CLINICAL DATA: Central line placement

EXAM:
PORTABLE CHEST 1 VIEW

[chest ap]
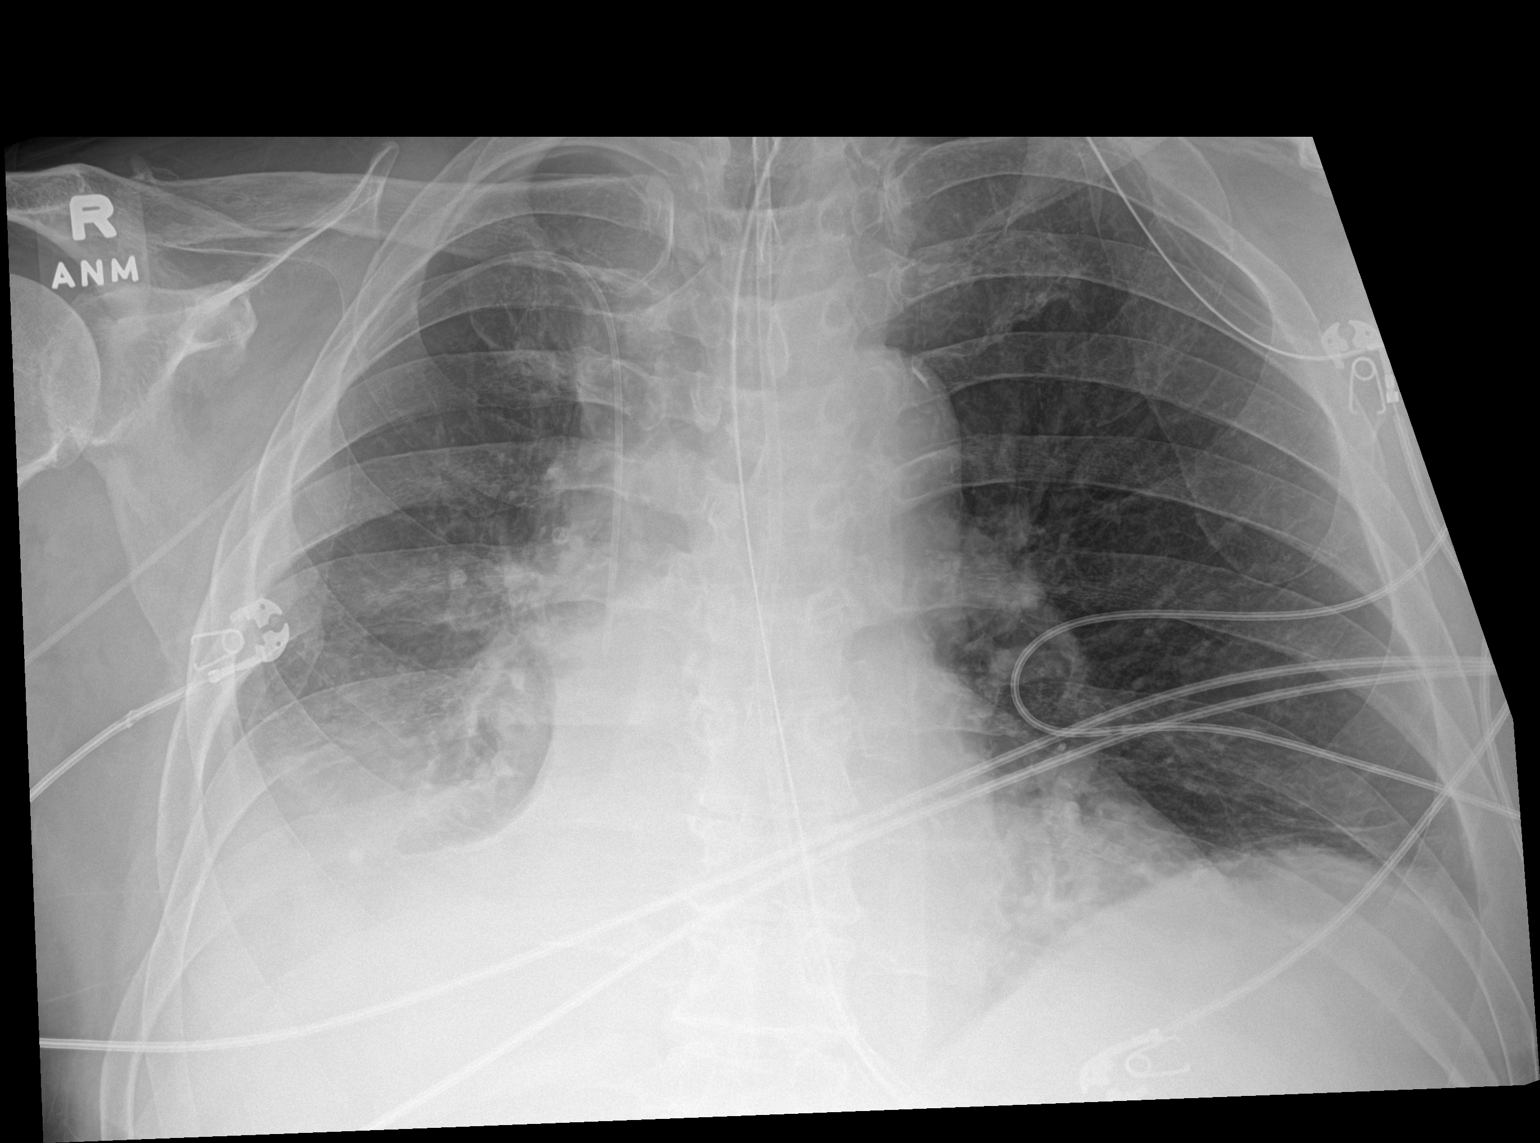

[1 of 1 positions shown; findings below may reference images not displayed]

FINDINGS: Feeding tube and nasogastric tube extends into abdomen.

RIGHT arm PICC line tip projects over SVC.

Normal heart size, mediastinal contours, and pulmonary vascularity.

Atherosclerotic calcification aorta.

RIGHT pleural effusion and basilar atelectasis, little changed.

Minimal LEFT basilar atelectasis.

No new infiltrate or pneumothorax.
IMPRESSION: RIGHT pleural effusion with bibasilar atelectasis RIGHT greater than
LEFT.

Tip of RIGHT arm PICC line projects over SVC

## 2023-05-15 IMAGING — DX DG ABD PORTABLE 1V
1 series · 1 of 1 positions shown · non-contrast
Comparison: Previous studies including the examination done earlier
today

CLINICAL DATA: Abdominal pain and distention

EXAM:
PORTABLE ABDOMEN - 1 VIEW

[abdomen]
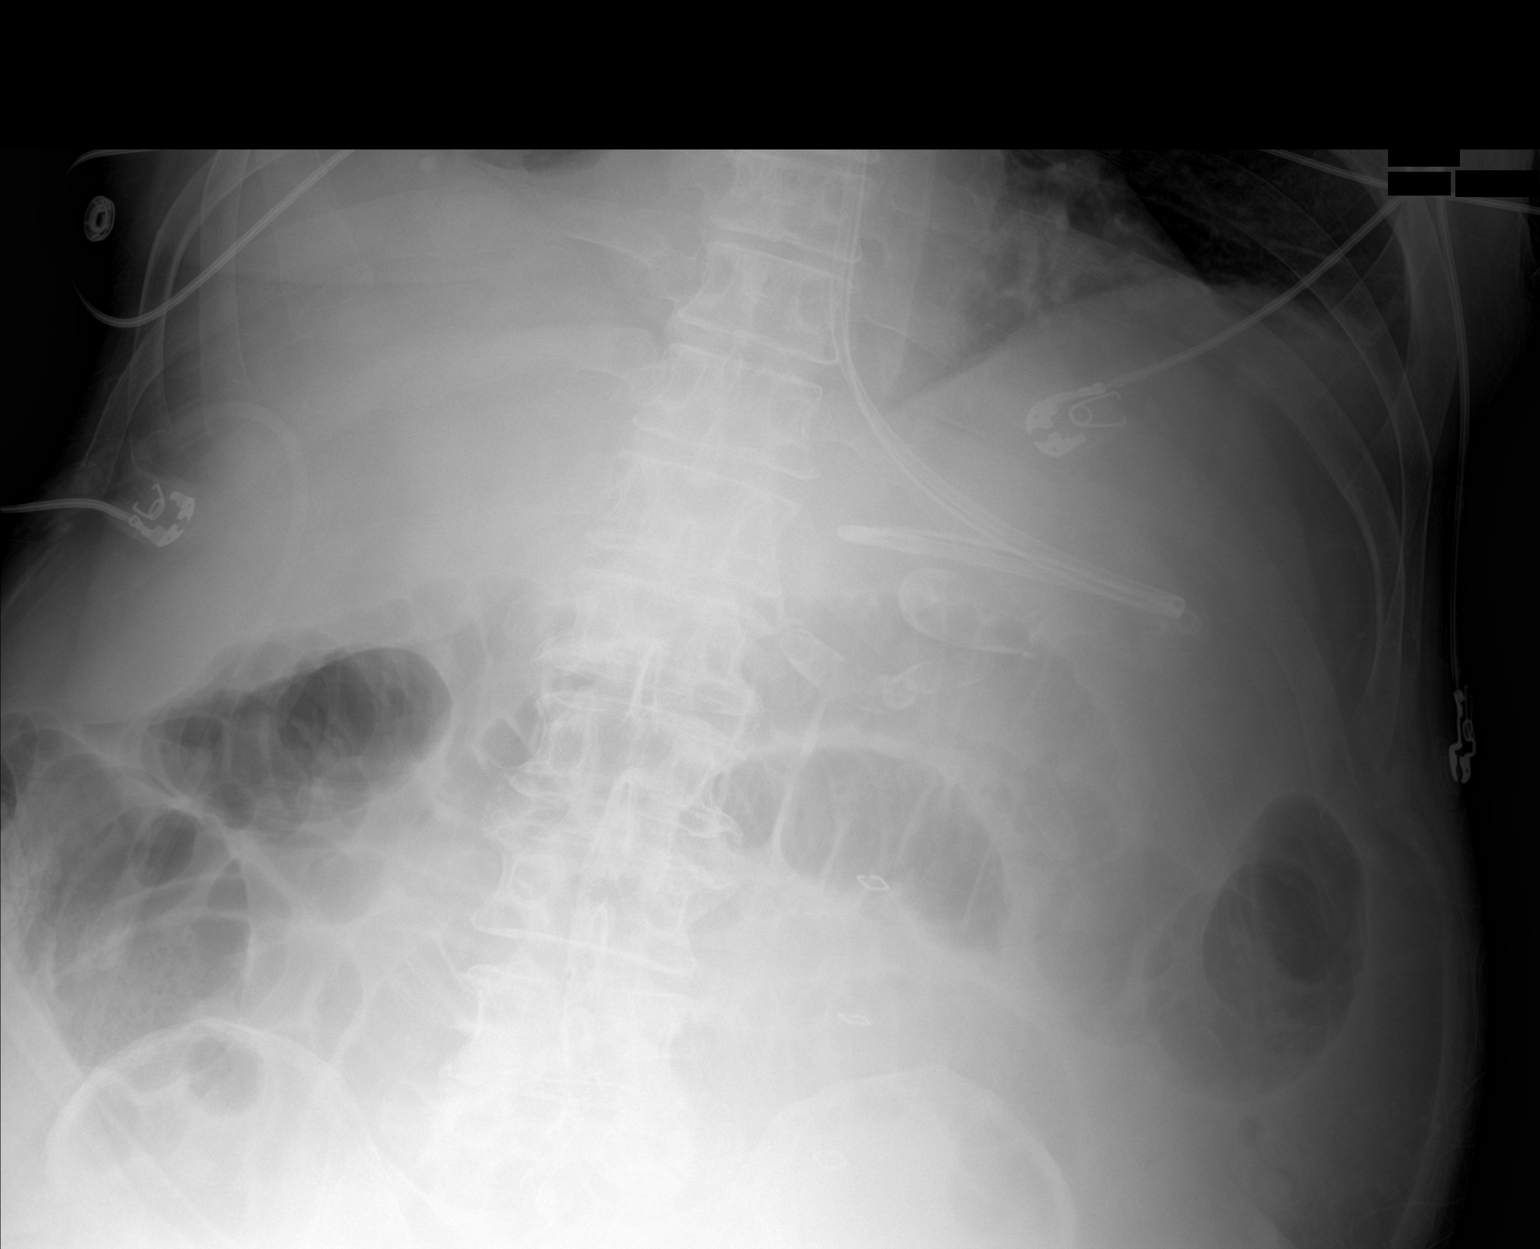

[1 of 1 positions shown; findings below may reference images not displayed]

FINDINGS: There is dilation of small-bowel loops in the visualized upper
abdomen suggesting ileus or partial small bowel obstruction. Tip
feeding tube is noted in the stomach. There is possible another
catheter adjacent to the feeding tube which was not evident in the
previous examination. This may suggest placement of second NG tube.
IMPRESSION: Tip of feeding tube is seen in the stomach. There is possible
placement of second nasogastric tube with its tip in the stomach.
There is dilation of small-bowel loops suggesting ileus or partial
small bowel obstruction.

## 2023-05-15 IMAGING — DX DG ABD PORTABLE 1V
1 series · 2 of 2 positions shown · non-contrast
Comparison: Radiograph 11/21/2021

CLINICAL DATA: Ileus, abdominal pain

EXAM:
PORTABLE ABDOMEN - 1 VIEW

[Series 1: abdomen · 0.14mm/px · 2 of 2 slices shown]
[im 1/2]
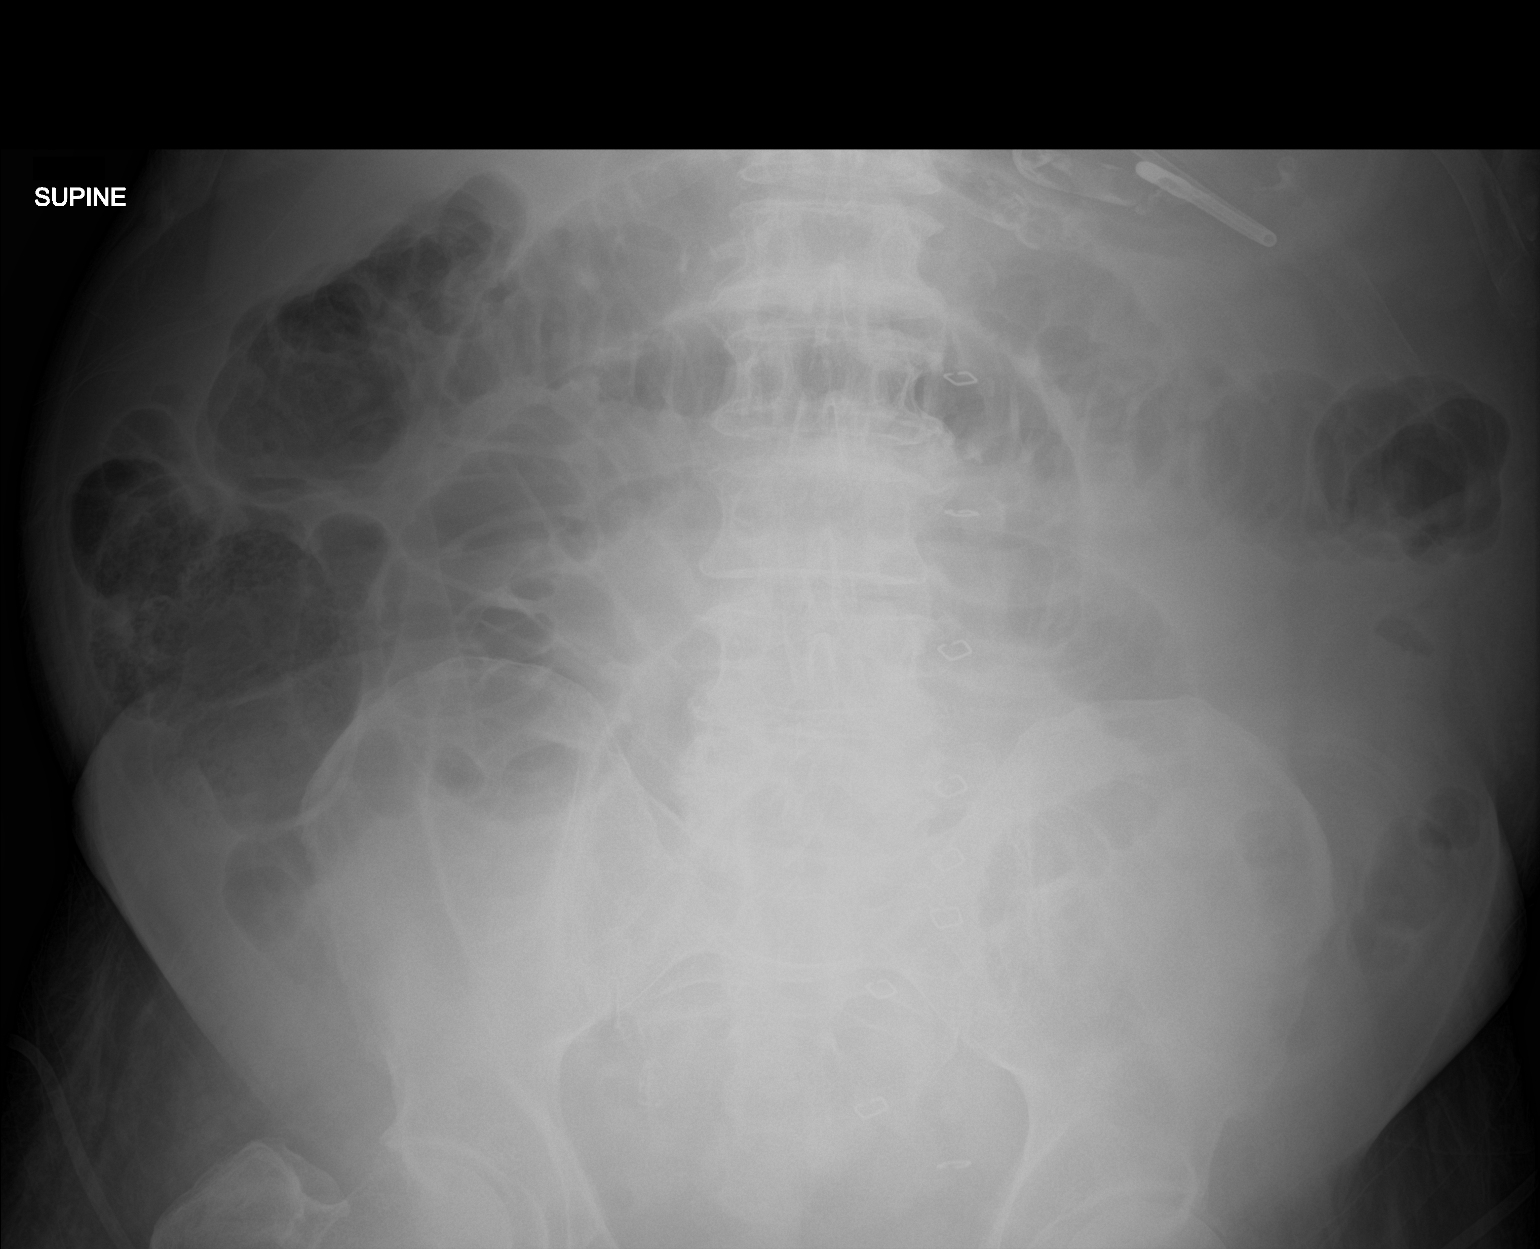
[im 2/2]
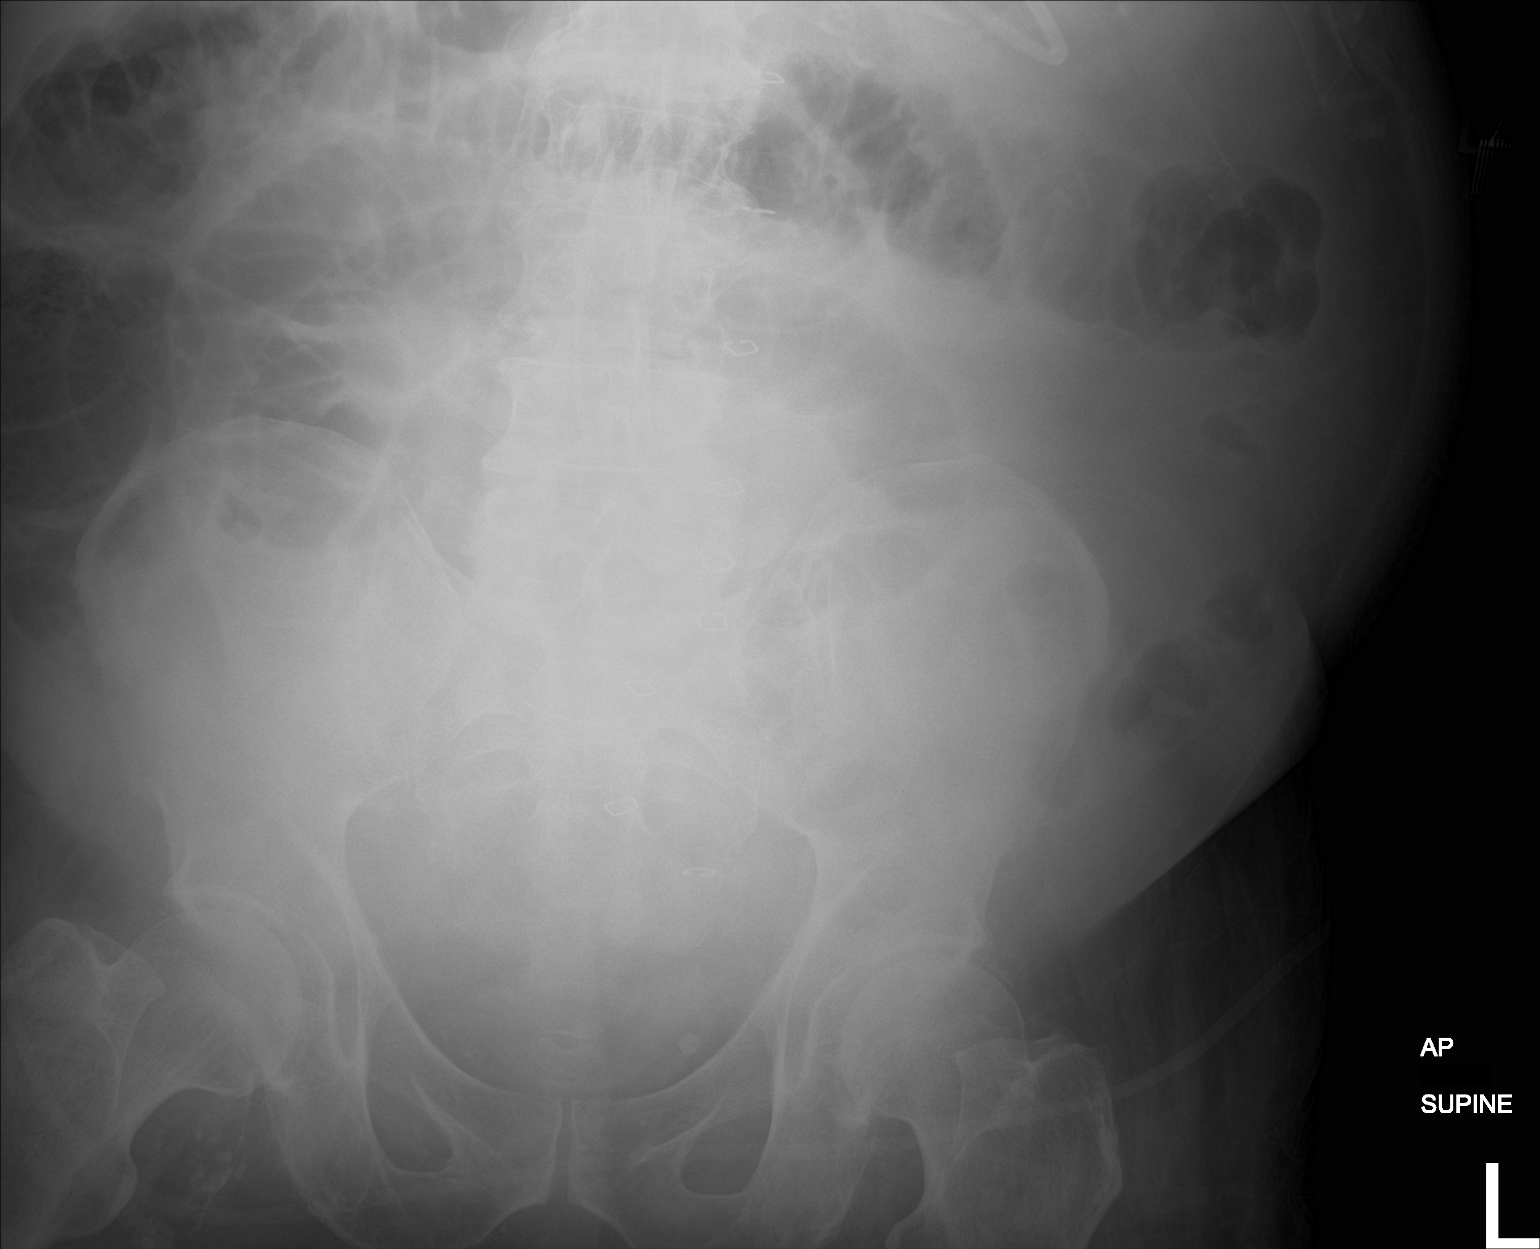

[2 of 2 positions shown; findings below may reference images not displayed]

FINDINGS: Feeding tube with tip in the proximal stomach.

Multiple dilated loops of small bowel up to 4 cm. The number in
diameter of dilated small bowel is increased from comparison exam.
There is gas at the splenic flexure of the colon. No clear gas in
the rectum.
IMPRESSION: Progressive dilatation of the small bowel and no gas the rectum.
Differential includes ileus versus small-bowel obstruction

Feeding tube in stomach.

## 2023-05-15 IMAGING — DX DG CHEST 1V PORT
1 series · 1 of 1 positions shown · non-contrast
Comparison: 11/22/2021.

CLINICAL DATA: Respiratory failure.

EXAM:
PORTABLE CHEST 1 VIEW

[chest ap]
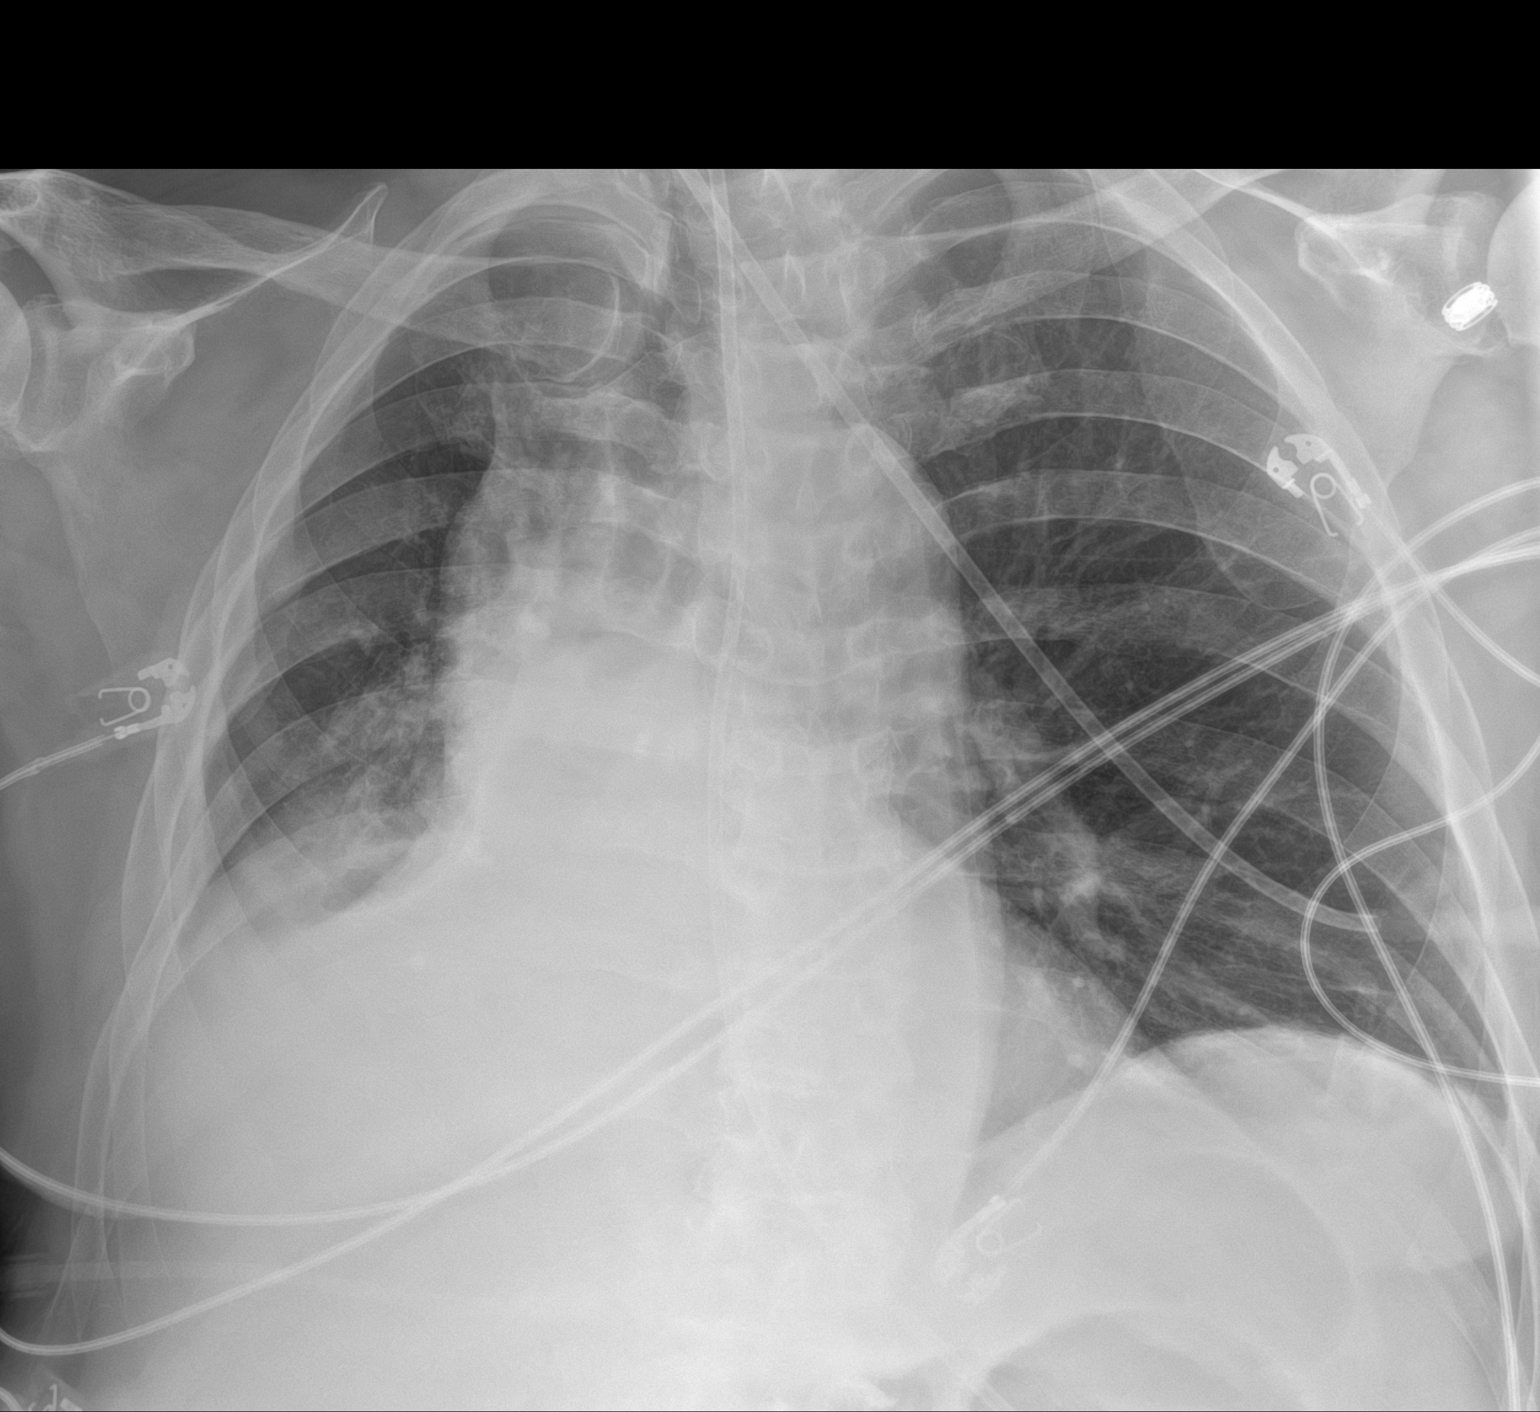

[1 of 1 positions shown; findings below may reference images not displayed]

FINDINGS: The heart is enlarged and the mediastinal contour is stable.
Atherosclerotic calcification of the aorta is noted. There is a
small to moderate right pleural effusion with atelectasis at the
lung bases bilaterally, not significantly changed from the prior
exam. No pneumothorax. Cervical spinal fusion hardware is noted. An
enteric tube courses over the left upper quadrant and out of the
field of view.
IMPRESSION: Small to moderate right pleural effusion with atelectasis at the
lung bases.
